# Patient Record
Sex: Male | Born: 1995 | Race: Black or African American | Hispanic: No | Marital: Single | Smoking: Never smoker
Health system: Southern US, Community
[De-identification: ages and names within clinical notes are randomized; demographics above are authoritative.]

## PROBLEM LIST (undated history)

## (undated) ENCOUNTER — Ambulatory Visit (HOSPITAL_COMMUNITY): Admission: EM | Disposition: A | Discharge status: Home / Self Care | Payer: BLUE CROSS/BLUE SHIELD

## (undated) ENCOUNTER — Ambulatory Visit (HOSPITAL_COMMUNITY): Admission: EM | Payer: Managed Care, Other (non HMO) | Source: Home / Self Care

## (undated) DIAGNOSIS — J45909 Unspecified asthma, uncomplicated: Secondary | ICD-10-CM

---
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
## 2016-02-18 ENCOUNTER — Emergency Department (INDEPENDENT_AMBULATORY_CARE_PROVIDER_SITE_OTHER): Payer: Medicaid Other

## 2016-02-18 ENCOUNTER — Encounter (HOSPITAL_COMMUNITY): Payer: Self-pay | Admitting: Emergency Medicine

## 2016-02-18 ENCOUNTER — Emergency Department (INDEPENDENT_AMBULATORY_CARE_PROVIDER_SITE_OTHER)
Admission: EM | Admit: 2016-02-18 | Discharge: 2016-02-18 | Disposition: A | Discharge status: Home / Self Care | Payer: Medicaid Other | Source: Home / Self Care | Attending: Family Medicine | Admitting: Family Medicine

## 2016-02-18 DIAGNOSIS — M2141 Flat foot [pes planus] (acquired), right foot: Secondary | ICD-10-CM

## 2016-02-18 DIAGNOSIS — M2142 Flat foot [pes planus] (acquired), left foot: Secondary | ICD-10-CM | POA: Diagnosis not present

## 2016-02-18 DIAGNOSIS — M79671 Pain in right foot: Secondary | ICD-10-CM

## 2016-02-18 NOTE — ED Notes (Signed)
[facility] with a complaint of a left foot injury and swelling secondary to a soccer game yesterday.

## 2016-02-18 NOTE — ED Provider Notes (Signed)
CSN: [id number]     Arrival date & time 02/18/16  1346 History   First MD Initiated Contact with Patient 02/18/16 1526     Chief Complaint  Patient presents with  . Foot Injury   (Consider location/radiation/quality/duration/timing/severity/associated sxs/prior Treatment) HPI [place] Swie[place] is a right-handed 20 year old male who presents with bilateral foot pain, worse on right, and left wrist pain. Around a year ago he was seen at Urgent Care, diagnosed with pes planus, and instructed to get arch supports, which he did not do. His foot pain is intermittent this past year. Yesterday while playing club soccer, his right foot began to hurt. He played through the pain and afterwards felt he could not bear weight and has been limping. He has only tried ice. The foot is swollen and sometimes feels weak. He has pain with weightbearing and when dangling his foot. He denies numbness/tingling, ecchymosis, or using assistive devices to walk.  His wrist was hit a few times during the soccer game and he also fell on it. He experiences 5/10 pain at his ulnar styloid. He has tried nothing for his pain. He denies numbness/tingling, weak hand grip, and ecchymosis. Two years ago he sprained the wrist and wore a brace for a month.  He had a sports physical last year and [facility]  Past Medical History  Diagnosis Date  . Asthma    History reviewed. No pertinent past surgical history. Family History  Problem Relation Age of Onset  . Diabetes Neg Hx   . Cancer Neg Hx   . Heart failure Neg Hx   . Hyperlipidemia Neg Hx    Social History  Substance Use Topics  . Smoking status: Never Smoker   . Smokeless tobacco: None  . Alcohol Use: No    Review of Systems See HPI; otherwise negative. Allergies  Review of patient's allergies indicates no known allergies.  Home Medications   Prior to Admission medications   Medication Sig Start Date End Date Taking?  Authorizing Provider  albuterol (PROVENTIL HFA;VENTOLIN HFA) 108 (90 Base) MCG/ACT inhaler Inhale into the lungs every 6 (six) hours as needed for wheezing or shortness of breath.   Yes Historical Provider, MD  fluticasone (CUTIVATE) 0.05 % cream Apply topically 2 (two) times daily. 09/23/15   Linna HoffJames D Kindl, MD  naproxen (NAPROSYN) 500 MG tablet Take 1 tablet (500 mg total) by mouth 2 (two) times daily. 05/15/15   Rodolph BongEvan S Corey, MD   Meds Ordered and Administered this Visit  Medications - No data to display  BP 127/71 mmHg  Pulse 57  Temp(Src) 97.7 F (36.5 C) (Oral)  Resp 14  SpO2 97% No data found.   Physical Exam General: Well-appearing, thin, pleasant African American male CV: RRR, no murmurs/rubs/gallops Lungs: CTAB, no wheezes/rales/rhonchi Abd: BS present, soft, NTND Upper extremities: No tenderness to palpation over left ulnar styloid nor throughout elbow/forearm/hands; elbows and wrists exhibit full ROM and 5/5 strength bilaterally; mild pain with left wrist pronation and flexion; no snuffbox tenderness; neurovascularly intact bilaterally Lower extremities: Knees, ankles exhibit full ROM and 5/5 strength bilaterally; neurovascularly intact bilaterally; right foot swollen; no calcaneal/Achilles tenderness; pain with horizontal and vertical compression at plantar surface; ples planus in both feet  ED Course  Procedures (including critical care time)  Labs Review Labs Reviewed - No data to display  Imaging Review Dg Foot Complete Right  02/18/2016  CLINICAL DATA:  Injured foot playing soccer yesterday. EXAM: RIGHT  FOOT COMPLETE - 3+ VIEW COMPARISON:  None. FINDINGS: The joint spaces are maintained.  No acute fracture is identified. IMPRESSION: No acute bony findings. Electronically Signed   By: Rudie Meyer M.D.   On: 02/18/2016 16:38     Visual Acuity Review  Right Eye Distance:   Left Eye Distance:   Bilateral Distance:    Right Eye Near:   Left Eye Near:     Bilateral Near:       Review of XR with patient and his mother.  No acute injury noted.   MDM   1. Foot pain, right   2. Pes planus of both feet    Xrays demonstrated no stress fractures. We will refer patient to podiatry for care of his pes planus. Instruct to use RICE treatment, Activity as tolerated I believe he has a simple left wrist sprain which can be treated with RICE and Ibuprofen OTC.    Tharon Aquas, PA 02/18/16 1659

## 2016-02-18 NOTE — Discharge Instructions (Signed)
Flat Feet Having flat feet is a common condition. One foot or both might be affected. People of any age can have flat feet. In fact, everyone is born with them. But most of the time, the foot gradually develops an arch. That is the curve on the bottom of the foot that creates a gap between the foot and the ground. An arch usually develops in childhood. Sometimes, though, an arch never develops and the foot stays flat on the bottom. Other times, an arch develops but later collapses (caves in). That is what gives the condition its nickname, "fallen arches." The medical term for flat feet is pes planus. Some people have flat feet their whole life and have no problems. For others, the condition causes pain and needs to be corrected.  CAUSES   A problem with the foot's soft tissue; tendons and ligaments could be loose.  This can cause what is called flexible flat feet. That means the shape of the foot changes with pressure. When standing on the toes, a curved arch can be seen. When standing on the ground, the foot is flat.  Wear and tear. Sometimes arches simply flatten over time.  Damage to the posterior tibial tendon. This is the tendon that goes from the inside of the ankle to the bones in the middle of the foot. It is the main support for the arch. If the tendon is injured, stretched or torn, the arch might flatten.  Tarsal coalition. With this condition, two or more bones in the foot are joined together (fused ) during development in the womb. This limits movement and can lead to a flat foot. SYMPTOMS   The foot is even with the ground from toe to heel. Your caregiver will look closely at the inside of the foot while you are standing.  Pain along the bottom of the foot. Some people describe the pain as tightness.  Swelling on the inside of the foot or ankle.  Changes in the way you walk (gait).  The feet lean inward, starting at the ankle (pronation). DIAGNOSIS  To decide if a child or  adult has flat feet, a healthcare provider will probably:  Do a physical examination. This might include having the person stand on his or her toes and then stand normally. The caregiver will also hold the foot and put pressure on the foot in different directions.  Check the person's shoes. The pattern of wear on the soles can offer clues.  Order images (pictures) of the foot. They can help identify the cause of any pain. They also will show injuries to bones or tendons that could be causing the condition. The images can come from:  X-rays.  Computed tomography (CT) scan. This combines X-ray and a computer.  Magnetic resonance imaging (MRI). This uses magnets, radio waves and a computer to take a picture of the foot. It is the best technique to evaluate tendons, ligaments and muscles. TREATMENT   Flexible flat feet usually are painless. Most of the time, gait is not affected. Most children grow out of the condition. Often no treatment is needed. If there is pain, treatment options include:  Orthotics. These are inserts that go in the shoes. They add support and shape to the feet. An orthotic is custom-made from a mold of the foot.  Shoes. Not all shoes are the same. People with flat feet need arch support. However, too much can be painful. It is important to find shoes that offer the right amount   of support. Athletes, especially runners, may need to try shoes made just for people with flatter feet.  Medication. For pain, only take over-the-counter medicine for pain, discomfort, as directed by your caregiver.  Rest. If the feet start to hurt, cut back on the exercise which increases the pain. Use common sense.  For damage to the posterior tibial tendon, options include:  Orthotics. Also adding a wedge on the inside edge may help. This can relieve pressure on the tendon.  Ankle brace, boot or cast. These supports can ease the load on the tendon while it heals.  Surgery. If the tendon is  torn, it might need to be repaired.  For tarsal coalition, similar options apply:  Pain medication.  Orthotics.  A cast and crutches. This keeps weight off the foot.  Physical therapy.  Surgery to remove the bone bridge joining the two bones together. PROGNOSIS  In most people, flat feet do not cause pain or problems. People can go about their normal activities. However, if flat feet are painful, they can and should be treated. Treatment usually relieves the pain. HOME CARE INSTRUCTIONS   Take any medications prescribed by the healthcare provider. Follow the directions carefully.  Wear, or make sure a child wears, orthotics or special shoes if this was suggested. Be sure to ask how often and for how long they should be worn.  Do any exercises or therapy treatments that were suggested.  Take notes on when the pain occurs. This will help healthcare providers decide how to treat the condition.  If surgery is needed, be sure to find out if there is anything that should or should not be done before the operation. SEEK MEDICAL CARE IF:   Pain worsens in the foot or lower leg.  Pain disappears after treatment, but then returns.  Walking or simple exercise becomes difficult or causes foot pain.  Orthotics or special shoes are uncomfortable or painful.   This information is not intended to replace advice given to you by your health care provider. Make sure you discuss any questions you have with your health care provider.   Document Released: 09/29/2009 Document Revised: 02/24/2012 Document Reviewed: 05/31/2015 Elsevier Interactive Patient Education 2016 [employer]  

## 2016-08-15 ENCOUNTER — Ambulatory Visit (HOSPITAL_COMMUNITY)
Admission: EM | Admit: 2016-08-15 | Discharge: 2016-08-15 | Discharge status: Absent without leave | Payer: Medicaid Other

## 2016-10-15 ENCOUNTER — Encounter (HOSPITAL_COMMUNITY): Payer: Self-pay | Admitting: *Deleted

## 2016-10-15 ENCOUNTER — Ambulatory Visit (HOSPITAL_COMMUNITY)
Admission: EM | Admit: 2016-10-15 | Discharge: 2016-10-15 | Disposition: A | Discharge status: Home / Self Care | Payer: Medicaid Other | Attending: Family Medicine | Admitting: Family Medicine

## 2016-10-15 ENCOUNTER — Ambulatory Visit (INDEPENDENT_AMBULATORY_CARE_PROVIDER_SITE_OTHER): Payer: Medicaid Other

## 2016-10-15 DIAGNOSIS — S63601A Unspecified sprain of right thumb, initial encounter: Secondary | ICD-10-CM | POA: Diagnosis not present

## 2016-10-15 NOTE — ED Triage Notes (Signed)
Pt  Was  Playing  Soccer  And  r  Thumb  Was  Injured  By  [employer]  Ball  The  Thumb  Was  Bent  Back  rom  Is  Present  But   Cannot  Lift  certain objects

## 2016-10-15 NOTE — ED Provider Notes (Addendum)
[facility]    CSN: [id number] Arrival date & time: 10/15/16  1743     History   Chief Complaint Chief Complaint  Patient presents with  . Hand Injury    HPI Austin Brock is a 20 y.o. male.   The history is provided by the patient.  Hand Injury  Location:  Finger Finger location:  R thumb Injury: yes   Time since incident:  5 days Mechanism of injury: fall   Mechanism of injury comment:  Fell playing soccer when struck by ball and hyperext.   Past Medical History:  Diagnosis Date  . Asthma     There are no active problems to display for this patient.   History reviewed. No pertinent surgical history.     Home Medications    Prior to Admission medications   Medication Sig Start Date End Date Taking? Authorizing Provider  albuterol (PROVENTIL HFA;VENTOLIN HFA) 108 (90 Base) MCG/ACT inhaler Inhale into the lungs every 6 (six) hours as needed for wheezing or shortness of breath.    Historical Provider, MD  fluticasone (CUTIVATE) 0.05 % cream Apply topically 2 (two) times daily. 09/23/15   Linna HoffJames D Madelyn Tlatelpa, MD  naproxen (NAPROSYN) 500 MG tablet Take 1 tablet (500 mg total) by mouth 2 (two) times daily. 05/15/15   Rodolph BongEvan S Corey, MD    Family History Family History  Problem Relation Age of Onset  . Diabetes Neg Hx   . Cancer Neg Hx   . Heart failure Neg Hx   . Hyperlipidemia Neg Hx     Social History Social History  Substance Use Topics  . Smoking status: Never Smoker  . Smokeless tobacco: Not on file  . Alcohol use No     Allergies   Review of patient's allergies indicates no known allergies.   Review of Systems Review of Systems  Constitutional: Negative.   Musculoskeletal: Positive for joint swelling.  Skin: Negative.   All other systems reviewed and are negative.    Physical Exam Triage Vital Signs ED Triage Vitals  Enc Vitals Group     BP 10/15/16 1753 118/72     Pulse Rate 10/15/16 1753 78     Resp 10/15/16 1753 18   Temp 10/15/16 1753 98.6 F (37 C)     Temp src --      SpO2 10/15/16 1753 100 %     Weight --      Height --      Head Circumference --      Peak Flow --      Pain Score 10/15/16 1752 5     Pain Loc --      Pain Edu? --      Excl. in GC? --    No data found.   Updated Vital Signs BP 118/72 (BP Location: Right Arm)   Pulse 78   Temp 98.6 F (37 C)   Resp 18   SpO2 100%   Visual Acuity Right Eye Distance:   Left Eye Distance:   Bilateral Distance:    Right Eye Near:   Left Eye Near:    Bilateral Near:     Physical Exam  Constitutional: He is oriented to person, place, and time. He appears well-developed and well-nourished. No distress.  Musculoskeletal: He exhibits tenderness. He exhibits no deformity.       Right hand: He exhibits tenderness.       Hands: Neurological: He is alert and oriented to person, place, and time.  Skin: Skin is warm.  Nursing note and vitals reviewed.    UC Treatments / Results  Labs (all labs ordered are listed, but only abnormal results are displayed) Labs Reviewed - No data to display  EKG  EKG Interpretation None       Radiology No results found. X-rays reviewed and report per radiologist.  Procedures Procedures (including critical care time)  Medications Ordered in UC Medications - No data to display   Initial Impression / Assessment and Plan / UC Course  I have reviewed the triage vital signs and the nursing notes.  Pertinent labs & imaging results that were available during my care of the patient were reviewed by me and considered in my medical decision making (see chart for details).  Clinical Course      Final Clinical Impressions(s) / UC Diagnoses   Final diagnoses:  Sprain of right thumb, unspecified site of finger, initial encounter    New Prescriptions Discharge Medication List as of 10/15/2016  6:46 PM       Linna HoffJames D Azalyn Sliwa, MD 10/18/16 2039    Linna HoffJames D Nomi Rudnicki, MD 10/18/16 2040

## 2018-03-29 ENCOUNTER — Emergency Department (HOSPITAL_COMMUNITY)
Admission: EM | Admit: 2018-03-29 | Discharge: 2018-03-29 | Discharge status: Against Medical Advice | Payer: BLUE CROSS/BLUE SHIELD

## 2018-03-29 NOTE — ED Notes (Signed)
Patient came to the desk twice asking when he would be seen because other patients were being seen before him..I explained to the patient that the patients that were called before him are a higher aquity than he was. He was offered a hallway bed that he refused to take because he wanted a room for more privacy. He began to get very loud and started recording me with his cell phone because he felt that he was being mistreated. I asked him to put it away because of hospital policy but he kept being loud and continued to try to record me,I then called security to come assist me,

## 2019-11-04 ENCOUNTER — Encounter (HOSPITAL_COMMUNITY): Payer: Self-pay

## 2019-11-04 ENCOUNTER — Ambulatory Visit (HOSPITAL_COMMUNITY)
Admission: EM | Admit: 2019-11-04 | Discharge: 2019-11-04 | Disposition: A | Discharge status: Home / Self Care | Payer: BLUE CROSS/BLUE SHIELD | Attending: Internal Medicine | Admitting: Internal Medicine

## 2019-11-04 ENCOUNTER — Other Ambulatory Visit: Payer: Self-pay

## 2019-11-04 DIAGNOSIS — U071 COVID-19: Secondary | ICD-10-CM

## 2019-11-04 LAB — POC SARS CORONAVIRUS 2 AG: SARS Coronavirus 2 Ag: POSITIVE — AB

## 2019-11-04 LAB — POC SARS CORONAVIRUS 2 AG -  ED: SARS Coronavirus 2 Ag: POSITIVE — AB

## 2019-11-04 NOTE — ED Triage Notes (Signed)
Pt presents to UC w/ c/o sore throat, runny nose, headache since yesterday.

## 2019-11-04 NOTE — Discharge Instructions (Addendum)
Your rapid Covid test is positive You need to go home and quarantine for 10 days, fever free for 3 days and symptoms improving in order to get back to work. If your symptoms worsen to include worsening fever, shortness of breath you need to go to the hospital.

## 2019-11-05 NOTE — ED Provider Notes (Signed)
[facility]    CSN: [id number] Arrival date & time: 11/04/19  1452      History   Chief Complaint Chief Complaint  Patient presents with  . Sore Throat    HPI [place] is a 23 y.o. male.   Pt is a 23 year old male that presents today with sore throat, runny nose, headache. This has been constant since yesterday. He has not taken anything for the symptoms. No known sick contacts. Low grade fever. No chest pain, SOB.   ROS per HPI      Past Medical History:  Diagnosis Date  . Asthma     There are no active problems to display for this patient.   History reviewed. No pertinent surgical history.     Home Medications    Prior to Admission medications   Medication Sig Start Date End Date Taking? Authorizing Provider  albuterol (PROVENTIL HFA;VENTOLIN HFA) 108 (90 Base) MCG/ACT inhaler Inhale into the lungs every 6 (six) hours as needed for wheezing or shortness of breath.    [provider]  fluticasone (CUTIVATE) 0.05 % cream Apply topically 2 (two) times daily. 09/23/15   Linna Hoff, MD  naproxen (NAPROSYN) 500 MG tablet Take 1 tablet (500 mg total) by mouth 2 (two) times daily. 05/15/15   Rodolph Bong, MD    Family History Family History  Problem Relation Age of Onset  . Healthy Mother   . Healthy Father   . Diabetes Neg Hx   . Cancer Neg Hx   . Heart failure Neg Hx   . Hyperlipidemia Neg Hx     Social History Social History   Tobacco Use  . Smoking status: Never Smoker  . Smokeless tobacco: Never Used  Substance Use Topics  . Alcohol use: No  . Drug use: No     Allergies   Patient has no known allergies.   Review of Systems Review of Systems   Physical Exam Triage Vital Signs ED Triage Vitals  Enc Vitals Group     BP 11/04/19 1529 135/83     Pulse Rate 11/04/19 1529 80     Resp 11/04/19 1529 16     Temp 11/04/19 1529 99.4 F (37.4 C)     Temp Source 11/04/19 1529 Oral     SpO2 11/04/19 1529 100 %     Weight --      Height --      Head Circumference --      Peak Flow --      Pain Score 11/04/19 1531 7     Pain Loc --      Pain Edu? --      Excl. in GC? --    No data found.  Updated Vital Signs BP 135/83 (BP Location: Left Arm)   Pulse 80   Temp 99.4 F (37.4 C) (Oral)   Resp 16   SpO2 100%   Visual Acuity Right Eye Distance:   Left Eye Distance:   Bilateral Distance:    Right Eye Near:   Left Eye Near:    Bilateral Near:     Physical Exam Vitals signs and nursing note reviewed.  Constitutional:      General: He is not in acute distress.    Appearance: Normal appearance. He is not ill-appearing, toxic-appearing or diaphoretic.  HENT:     Head: Normocephalic and atraumatic.     Nose: Nose normal.  Eyes:     Conjunctiva/sclera: Conjunctivae normal.  Neck:     Musculoskeletal: Normal range of motion.  Pulmonary:     Effort: Pulmonary effort is normal.  Musculoskeletal: Normal range of motion.  Skin:    General: Skin is warm and dry.  Neurological:     Mental Status: He is alert.  Psychiatric:        Mood and Affect: Mood normal.      UC Treatments / Results  Labs (all labs ordered are listed, but only abnormal results are displayed) Labs Reviewed  POC SARS CORONAVIRUS 2 AG -  ED - Abnormal; Notable for the following components:      Result Value   SARS Coronavirus 2 Ag POSITIVE (*)    All other components within normal limits  POC SARS CORONAVIRUS 2 AG - Abnormal; Notable for the following components:   SARS Coronavirus 2 Ag POSITIVE (*)    All other components within normal limits    EKG   Radiology No results found.  Procedures Procedures (including critical care time)  Medications Ordered in UC Medications - No data to display  Initial Impression / Assessment and Plan / UC Course  I have reviewed the triage vital signs and the nursing notes.  Pertinent labs & imaging results that were available during my care of the patient were  reviewed by me and considered in my medical decision making (see chart for details).     COVID-19-rapid Covid test positive.  We will have him go home and quarantine for 10 days. OTC meds as needed for symptoms.  Return precautions given Final Clinical Impressions(s) / UC Diagnoses   Final diagnoses:  [id number]     Discharge Instructions     Your rapid Covid test is positive You need to go home and quarantine for 10 days, fever free for 3 days and symptoms improving in order to get back to work. If your symptoms worsen to include worsening fever, shortness of breath you need to go to the hospital.    ED Prescriptions    None     PDMP not reviewed this encounter.   Orvan July, NP 11/05/19 [phone]

## 2019-11-09 ENCOUNTER — Ambulatory Visit (INDEPENDENT_AMBULATORY_CARE_PROVIDER_SITE_OTHER)
Admission: RE | Admit: 2019-11-09 | Discharge: 2019-11-09 | Disposition: A | Discharge status: Home / Self Care | Payer: BLUE CROSS/BLUE SHIELD | Source: Ambulatory Visit

## 2019-11-09 DIAGNOSIS — U071 COVID-19: Secondary | ICD-10-CM

## 2019-11-09 MED ORDER — CETIRIZINE-PSEUDOEPHEDRINE ER 5-120 MG PO TB12: 1.0000 | ORAL_TABLET | Freq: Every day | ORAL | 0 refills | Status: DC

## 2019-11-09 MED ORDER — FLUTICASONE PROPIONATE 50 MCG/ACT NA SUSP: 2.0000 | Freq: Every day | NASAL | 0 refills | Status: DC

## 2019-11-09 MED ORDER — ACETAMINOPHEN 500 MG PO CHEW: 500.0000 mg | CHEWABLE_TABLET | Freq: Four times a day (QID) | ORAL | 0 refills | Status: DC | PRN

## 2019-11-09 MED ORDER — BENZONATATE 100 MG PO CAPS: 100.0000 mg | ORAL_CAPSULE | Freq: Three times a day (TID) | ORAL | 0 refills | Status: DC

## 2019-11-09 NOTE — Discharge Instructions (Signed)
COVID test positive.    In the meantime: You should remain isolated in your home for 10 days from symptom onset AND greater than 72 hours after symptoms resolution (absence of fever without the use of fever-reducing medication and improvement in respiratory symptoms), whichever is longer Get plenty of rest and push fluids Tessalon Perles prescribed for cough Zyrtec-D prescribed for nasal congestion, runny nose, and/or sore throat Flonase prescribed for nasal congestion and runny nose Use medications daily for symptom relief Use OTC medications like ibuprofen or tylenol as needed fever or pain Tylenol also sent into pharmacy Call or go to the ED if you have any new or worsening symptoms such as fever, worsening cough, shortness of breath, chest tightness, chest pain, turning blue, changes in mental status, etc..Marland Kitchen

## 2019-11-09 NOTE — ED Provider Notes (Signed)
[place] Virtual Visit via Video Note:  Austin Brock  initiated request for Telemedicine visit with [facility] team. I connected with [facility]  on 11/09/2019 at 2:09 PM  for a synchronized telemedicine visit using a video enabled HIPPA compliant telemedicine application. I verified that I am speaking with [facility]  using two identifiers. [place], PA-C  was physically located in a [place] [facility] site and [facility] was located at a different location.   The limitations of evaluation and management by telemedicine as well as the availability of in-person appointments were discussed. Patient was informed that he  may incur a bill ( including co-pay) for this virtual visit encounter. Austin Brock  expressed understanding and gave verbal consent to proceed with virtual visit.   [id number] 11/09/19 Arrival Time: 2993  CC: COVID symptoms; + covid test  SUBJECTIVE: History from: patient.  Austin Brock is a 23 y.o. male hx significant for asthma, who presents with headache, runny nose, sneezing, cough with dark yellow sputum, sore throat, and fatigue that began on 11/03/2019.  Tested positive for COVID on 11/04/2019.  Has tried OTC ibuprofen twice daily without relief.  Symptoms are made worse with activity.  Denies previous symptoms in the past.   Denies fever, chills, rhinorrhea, SOB, wheezing, chest pain, nausea, vomiting, changes in bowel or bladder habits.    ROS: As per HPI.  All other pertinent ROS negative.     Past Medical History:  Diagnosis Date  . Asthma    History reviewed. No pertinent surgical history. No Known Allergies No current facility-administered medications on file prior to encounter.    Current Outpatient Medications on File Prior to Encounter  Medication Sig Dispense Refill  . albuterol (PROVENTIL HFA;VENTOLIN HFA) 108 (90 Base) MCG/ACT inhaler Inhale into the lungs every 6 (six) hours as needed for  wheezing or shortness of breath.    . fluticasone (CUTIVATE) 0.05 % cream Apply topically 2 (two) times daily. 30 g 0  . naproxen (NAPROSYN) 500 MG tablet Take 1 tablet (500 mg total) by mouth 2 (two) times daily. 30 tablet 0    OBJECTIVE:   There were no vitals filed for this visit.  General appearance: alert; no distress Eyes: EOMI grossly; wears corrective lenses HENT: normocephalic; atraumatic Neck: supple with FROM Lungs: normal respiratory effort; speaking in full sentences without difficulty Extremities: moves extremities without difficulty Skin: No obvious rashes Neurologic: No facial asymmetries Psychological: alert and cooperative; normal mood and affect  ASSESSMENT & PLAN:  1. COVID-19 virus infection     Meds ordered this encounter  Medications  . acetaminophen (TYLENOL) 500 MG chewable tablet    Sig: Chew 1 tablet (500 mg total) by mouth every 6 (six) hours as needed for pain or fever.    Dispense:  30 tablet    Refill:  0    Order Specific Question:   Supervising Provider    Answer:   Raylene Everts [[id number]]  . cetirizine-pseudoephedrine (ZYRTEC-D) 5-120 MG tablet    Sig: Take 1 tablet by mouth daily.    Dispense:  30 tablet    Refill:  0    Order Specific Question:   Supervising Provider    Answer:   Raylene Everts [[id number]]  . fluticasone (FLONASE) 50 MCG/ACT nasal spray    Sig: Place 2 sprays into both nostrils daily.    Dispense:  16 g    Refill:  0    Order Specific Question:  Supervising Provider    Answer:   Eustace Moore [[id number]]  . benzonatate (TESSALON) 100 MG capsule    Sig: Take 1 capsule (100 mg total) by mouth every 8 (eight) hours.    Dispense:  21 capsule    Refill:  0    Order Specific Question:   Supervising Provider    Answer:   Eustace Moore [[id number]]    COVID test positive.    In the meantime: You should remain isolated in your home for 10 days from symptom onset AND greater than 72 hours after symptoms  resolution (absence of fever without the use of fever-reducing medication and improvement in respiratory symptoms), whichever is longer Get plenty of rest and push fluids Tessalon Perles prescribed for cough Zyrtec-D prescribed for nasal congestion, runny nose, and/or sore throat Flonase prescribed for nasal congestion and runny nose Use medications daily for symptom relief Use OTC medications like ibuprofen or tylenol as needed fever or pain Tylenol also sent into pharmacy Call or go to the ED if you have any new or worsening symptoms such as fever, worsening cough, shortness of breath, chest tightness, chest pain, turning blue, changes in mental status, etc...  I discussed the assessment and treatment plan with the patient. The patient was provided an opportunity to ask questions and all were answered. The patient agreed with the plan and demonstrated an understanding of the instructions.   The patient was advised to call back or seek an in-person evaluation if the symptoms worsen or if the condition fails to improve as anticipated.  I provided 10 minutes of non-face-to-face time during this encounter.  [place], PA-C  11/09/2019 2:09 PM          Rennis Harding, PA-C 11/09/19 1409

## 2019-11-10 ENCOUNTER — Telehealth: Payer: BLUE CROSS/BLUE SHIELD

## 2019-11-10 ENCOUNTER — Inpatient Hospital Stay
Admission: RE | Admit: 2019-11-10 | Discharge: 2019-11-10 | Disposition: A | Discharge status: Home / Self Care | Payer: BLUE CROSS/BLUE SHIELD | Source: Ambulatory Visit

## 2019-11-12 ENCOUNTER — Telehealth: Payer: BLUE CROSS/BLUE SHIELD

## 2019-11-15 ENCOUNTER — Telehealth (HOSPITAL_COMMUNITY): Payer: Self-pay | Admitting: Emergency Medicine

## 2019-11-15 ENCOUNTER — Ambulatory Visit (HOSPITAL_COMMUNITY)
Admission: EM | Admit: 2019-11-15 | Discharge: 2019-11-15 | Disposition: A | Discharge status: Home / Self Care | Payer: BLUE CROSS/BLUE SHIELD

## 2019-11-15 ENCOUNTER — Telehealth: Payer: BLUE CROSS/BLUE SHIELD

## 2019-11-15 ENCOUNTER — Other Ambulatory Visit: Payer: Self-pay

## 2019-11-15 NOTE — ED Triage Notes (Signed)
Pt mistakenly thought he needed to be retested. Pt was positive for covid on 11/19. Has met his quarantine requirements. No symptoms, no fever. Per Dr. Meda Coffee, okay to write note saying he can return to work.

## 2019-11-15 NOTE — Telephone Encounter (Signed)
Spoke to pt over the phone regarding his medications. Answered all of his questions.

## 2020-06-17 ENCOUNTER — Ambulatory Visit (HOSPITAL_COMMUNITY)
Admission: EM | Admit: 2020-06-17 | Discharge: 2020-06-17 | Disposition: A | Discharge status: Home / Self Care | Payer: BLUE CROSS/BLUE SHIELD

## 2020-12-09 ENCOUNTER — Other Ambulatory Visit: Payer: Self-pay

## 2020-12-09 ENCOUNTER — Emergency Department (HOSPITAL_COMMUNITY): Payer: Medicaid Other

## 2020-12-09 ENCOUNTER — Emergency Department (HOSPITAL_COMMUNITY)
Admission: EM | Admit: 2020-12-09 | Discharge: 2020-12-09 | Disposition: A | Discharge status: Home / Self Care | Payer: Medicaid Other | Attending: Emergency Medicine | Admitting: Emergency Medicine

## 2020-12-09 ENCOUNTER — Encounter (HOSPITAL_COMMUNITY): Payer: Self-pay

## 2020-12-09 DIAGNOSIS — Z7952 Long term (current) use of systemic steroids: Secondary | ICD-10-CM | POA: Insufficient documentation

## 2020-12-09 DIAGNOSIS — E876 Hypokalemia: Secondary | ICD-10-CM | POA: Insufficient documentation

## 2020-12-09 DIAGNOSIS — F12920 Cannabis use, unspecified with intoxication, uncomplicated: Secondary | ICD-10-CM

## 2020-12-09 DIAGNOSIS — R0602 Shortness of breath: Secondary | ICD-10-CM | POA: Insufficient documentation

## 2020-12-09 DIAGNOSIS — R45851 Suicidal ideations: Secondary | ICD-10-CM | POA: Insufficient documentation

## 2020-12-09 DIAGNOSIS — F12929 Cannabis use, unspecified with intoxication, unspecified: Secondary | ICD-10-CM | POA: Insufficient documentation

## 2020-12-09 DIAGNOSIS — J45909 Unspecified asthma, uncomplicated: Secondary | ICD-10-CM | POA: Insufficient documentation

## 2020-12-09 LAB — COMPREHENSIVE METABOLIC PANEL
ALT: 16 U/L (ref 0–44)
AST: 32 U/L (ref 15–41)
Albumin: 4.1 g/dL (ref 3.5–5.0)
Alkaline Phosphatase: 93 U/L (ref 38–126)
Anion gap: 12 (ref 5–15)
BUN: 8 mg/dL (ref 6–20)
CO2: 23 mmol/L (ref 22–32)
Calcium: 8.8 mg/dL — ABNORMAL LOW (ref 8.9–10.3)
Chloride: 104 mmol/L (ref 98–111)
Creatinine, Ser: 1.17 mg/dL (ref 0.61–1.24)
GFR, Estimated: >60 mL/min (ref >60)
Glucose, Bld: 127 mg/dL — ABNORMAL HIGH (ref 70–99)
Potassium: 2.5 mmol/L — CL (ref 3.5–5.1)
Sodium: 139 mmol/L (ref 135–145)
Total Bilirubin: 1.8 mg/dL — ABNORMAL HIGH (ref 0.3–1.2)
Total Protein: 7.1 g/dL (ref 6.5–8.1)

## 2020-12-09 LAB — CBC
HCT: 42.5 % (ref 39.0–52.0)
Hemoglobin: 14.9 g/dL (ref 13.0–17.0)
MCH: 30.3 pg (ref 26.0–34.0)
MCHC: 35.1 g/dL (ref 30.0–36.0)
MCV: 86.4 fL (ref 80.0–100.0)
Platelets: 169 10*3/uL (ref 150–400)
RBC: 4.92 MIL/uL (ref 4.22–5.81)
RDW: 11.1 % — ABNORMAL LOW (ref 11.5–15.5)
WBC: 6.7 10*3/uL (ref 4.0–10.5)
nRBC: 0 % (ref 0.0–0.2)

## 2020-12-09 LAB — RAPID URINE DRUG SCREEN, HOSP PERFORMED
Amphetamines: NOT DETECTED
Barbiturates: NOT DETECTED
Benzodiazepines: NOT DETECTED
Cocaine: NOT DETECTED
Opiates: NOT DETECTED
Tetrahydrocannabinol: POSITIVE — AB

## 2020-12-09 LAB — ETHANOL: Alcohol, Ethyl (B): <10 mg/dL (ref <10)

## 2020-12-09 LAB — SALICYLATE LEVEL: Salicylate Lvl: <7 mg/dL — ABNORMAL LOW (ref 7.0–30.0)

## 2020-12-09 LAB — MAGNESIUM: Magnesium: 1.7 mg/dL (ref 1.7–2.4)

## 2020-12-09 LAB — ACETAMINOPHEN LEVEL: Acetaminophen (Tylenol), Serum: <10 ug/mL — ABNORMAL LOW (ref 10–30)

## 2020-12-09 MED ORDER — MAGNESIUM SULFATE 2 GM/50ML IV SOLN
2.0000 g | Freq: Once | INTRAVENOUS | Status: AC
  Administered 2020-12-09: 02:00:00 2 g via INTRAVENOUS
  Filled 2020-12-09: qty 50

## 2020-12-09 MED ORDER — POTASSIUM CHLORIDE CRYS ER 20 MEQ PO TBCR
60.0000 meq | EXTENDED_RELEASE_TABLET | Freq: Once | ORAL | Status: AC
  Administered 2020-12-09: 02:00:00 60 meq via ORAL
  Filled 2020-12-09: qty 3

## 2020-12-09 MED ORDER — SODIUM CHLORIDE 0.9 % IV BOLUS
1000.0000 mL | Freq: Once | INTRAVENOUS | Status: AC
  Administered 2020-12-09: 12:00:00 1000 mL via INTRAVENOUS

## 2020-12-09 MED ORDER — POTASSIUM CHLORIDE 10 MEQ/100ML IV SOLN
10.0000 meq | INTRAVENOUS | Status: AC
  Administered 2020-12-09 (×2): 10 meq via INTRAVENOUS
  Filled 2020-12-09 (×3): qty 100

## 2020-12-09 MED ORDER — LORAZEPAM 2 MG/ML IJ SOLN
1.0000 mg | Freq: Once | INTRAMUSCULAR | Status: AC
  Administered 2020-12-09: 02:00:00 1 mg via INTRAVENOUS
  Filled 2020-12-09: qty 1

## 2020-12-09 MED ORDER — POTASSIUM CHLORIDE CRYS ER 20 MEQ PO TBCR: 20.0000 meq | EXTENDED_RELEASE_TABLET | Freq: Two times a day (BID) | ORAL | 0 refills | Status: DC

## 2020-12-09 MED ORDER — SODIUM CHLORIDE 0.9 % IV BOLUS
1000.0000 mL | Freq: Once | INTRAVENOUS | Status: AC
  Administered 2020-12-09: 02:00:00 1000 mL via INTRAVENOUS

## 2020-12-09 NOTE — ED Notes (Signed)
Tele psych machine to bedside  

## 2020-12-09 NOTE — ED Notes (Addendum)
Upon ambulation of self in exam room, pt reported feeling dizzy and "still a little weird." Slightly stumbled while walking around bed. Vital signs remained stable. Pt was instructed to lay back down in bed after feeling dizzy.   Per PO fluid challenge, pt drank 1 cup of water and has continued to deny feelings of nausea.

## 2020-12-09 NOTE — ED Notes (Signed)
This RN wasted 0.5 ml of ativan with Biochemist, clinical and place waste in sink witness by [employer]

## 2020-12-09 NOTE — ED Notes (Signed)
Pt d/c home per MD order. Discharge summary reviewed with pt, pt verbalizes understanding. No s/s of acute distress noted. Ambulatory off unit. Father at bedside.  Discharged home with father,.

## 2020-12-09 NOTE — BH Assessment (Signed)
TTS attempted to assess patient, but he would not wake up to participate in the assessment.  TTS to attempt to assess patient later this morning.

## 2020-12-09 NOTE — ED Triage Notes (Signed)
Patient coming from home, reports he did something bad tonight", states he smoked some weed tonight, endorses feeling pressured speech, anxiety, depression, reports SI and HI, specific people but will not elaborate, family reports he may have taken something else.

## 2020-12-09 NOTE — ED Provider Notes (Signed)
[facility] EMERGENCY DEPARTMENT Provider Note   CSN: [id number] Arrival date & time: 12/09/20  0025     History Chief Complaint  Patient presents with  . Shortness of Breath  . Suicidal    Rayman Petrosian is a 24 y.o. male.  HPI Patient is a 24 year old male who presents the emergency department due to SI.  Patient states that he was "hanging out with his brothers" earlier tonight and they smoke marijuana.  He has smoked marijuana multiple times in the past.  No alcohol use.  He states that he went to sleep and woke up "in this state".  He states he was feeling chest pain and shortness of breath which has since alleviated.  He feels anxious as well as depressed.  Reports intermittent SI as well as HI.  He states that "sometimes he thinks about it".  No specific plan.  Patient denies any other drug use but has relatives noted to nursing staff that he "may have taken something else".  Patient tremulous on my exam and somewhat tangential.  Patient denies any visual or auditory hallucinations.     Past Medical History:  Diagnosis Date  . Asthma     There are no problems to display for this patient.   History reviewed. No pertinent surgical history.     Family History  Problem Relation Age of Onset  . Healthy Mother   . Healthy Father   . Diabetes Neg Hx   . Cancer Neg Hx   . Heart failure Neg Hx   . Hyperlipidemia Neg Hx     Social History   Tobacco Use  . Smoking status: Never Smoker  . Smokeless tobacco: Never Used  Substance Use Topics  . Alcohol use: No  . Drug use: Yes    Types: Marijuana    Home Medications Prior to Admission medications   Medication Sig Start Date End Date Taking? Authorizing Provider  acetaminophen (TYLENOL) 500 MG chewable tablet Chew 1 tablet (500 mg total) by mouth every 6 (six) hours as needed for pain or fever. 11/09/19   Wurst, [place], PA-C  albuterol (PROVENTIL HFA;VENTOLIN HFA) 108 (90 Base) MCG/ACT inhaler  Inhale into the lungs every 6 (six) hours as needed for wheezing or shortness of breath.    [provider]  benzonatate (TESSALON) 100 MG capsule Take 1 capsule (100 mg total) by mouth every 8 (eight) hours. 11/09/19   Wurst, [place], PA-C  cetirizine-pseudoephedrine (ZYRTEC-D) 5-120 MG tablet Take 1 tablet by mouth daily. 11/09/19   Wurst, [place], PA-C  fluticasone (CUTIVATE) 0.05 % cream Apply topically 2 (two) times daily. 09/23/15   Linna Hoff, MD  fluticasone (FLONASE) 50 MCG/ACT nasal spray Place 2 sprays into both nostrils daily. 11/09/19   Wurst, [place], PA-C  naproxen (NAPROSYN) 500 MG tablet Take 1 tablet (500 mg total) by mouth 2 (two) times daily. 05/15/15   Rodolph Bong, MD    Allergies    Patient has no known allergies.  Review of Systems   Review of Systems  All other systems reviewed and are negative. Ten systems reviewed and are negative for acute change, except as noted in the HPI.    Physical Exam Updated Vital Signs BP (!) 150/84 (BP Location: Left Arm)   Pulse 96   Temp 97.8 F (36.6 C) (Oral)   Resp 18   Ht 5\' 4"  (1.626 m)   Wt 61.2 kg   SpO2 100%   BMI 23.17 kg/m  Physical Exam Vitals and nursing note reviewed.  Constitutional:      General: He is not in acute distress.    Appearance: Normal appearance. He is well-developed and normal weight. He is not ill-appearing, toxic-appearing or diaphoretic.     Comments: Well-developed adult male lying in the semi-Fowlers position.  Mildly tremulous on my exam.  Tangential.  HENT:     Head: Normocephalic and atraumatic.     Right Ear: External ear normal.     Left Ear: External ear normal.     Nose: Nose normal.     Mouth/Throat:     Mouth: Mucous membranes are moist.     Pharynx: Oropharynx is clear. No oropharyngeal exudate or posterior oropharyngeal erythema.  Eyes:     Extraocular Movements: Extraocular movements intact.  Cardiovascular:     Rate and Rhythm: Normal rate and regular  rhythm.     Pulses: Normal pulses.     Heart sounds: Normal heart sounds. No murmur heard. No friction rub. No gallop.   Pulmonary:     Effort: Pulmonary effort is normal. No respiratory distress.     Breath sounds: Normal breath sounds. No stridor. No wheezing, rhonchi or rales.  Abdominal:     General: Abdomen is flat.     Tenderness: There is no abdominal tenderness.  Musculoskeletal:        General: Normal range of motion.     Cervical back: Normal range of motion and neck supple. No tenderness.  Skin:    General: Skin is warm and dry.  Neurological:     General: No focal deficit present.     Mental Status: He is alert and oriented to person, place, and time.  Psychiatric:        Mood and Affect: Mood is anxious.        Speech: Speech is rapid and pressured.        Behavior: Behavior is hyperactive.        Thought Content: Thought content is paranoid.    ED Results / Procedures / Treatments   Labs (all labs ordered are listed, but only abnormal results are displayed) Labs Reviewed  COMPREHENSIVE METABOLIC PANEL - Abnormal; Notable for the following components:      Result Value   Potassium 2.5 (*)    Glucose, Bld 127 (*)    Calcium 8.8 (*)    Total Bilirubin 1.8 (*)    All other components within normal limits  SALICYLATE LEVEL - Abnormal; Notable for the following components:   Salicylate Lvl <7.0 (*)    All other components within normal limits  ACETAMINOPHEN LEVEL - Abnormal; Notable for the following components:   Acetaminophen (Tylenol), Serum <10 (*)    All other components within normal limits  CBC - Abnormal; Notable for the following components:   RDW 11.1 (*)    All other components within normal limits  RAPID URINE DRUG SCREEN, HOSP PERFORMED - Abnormal; Notable for the following components:   Tetrahydrocannabinol POSITIVE (*)    All other components within normal limits  ETHANOL  MAGNESIUM   EKG None  Radiology No results  found.  Procedures Procedures (including critical care time)  Medications Ordered in ED Medications  sodium chloride 0.9 % bolus 1,000 mL (0 mLs Intravenous Stopped 12/09/20 0356)  LORazepam (ATIVAN) injection 1 mg (1 mg Intravenous Given 12/09/20 0154)  potassium chloride SA (KLOR-CON) CR tablet 60 mEq (60 mEq Oral Given 12/09/20 0206)  potassium chloride 10 mEq in 100 mL  IVPB (0 mEq Intravenous Stopped 12/09/20 0514)  magnesium sulfate IVPB 2 g 50 mL (0 g Intravenous Stopped 12/09/20 1093)    ED Course  I have reviewed the triage vital signs and the nursing notes.  Pertinent labs & imaging results that were available during my care of the patient were reviewed by me and considered in my medical decision making (see chart for details).  Clinical Course as of 12/09/20 0622  Sat Dec 09, 2020  0153 Potassium(!!): 2.5 [LJ]  0423 Patient has been sleeping comfortably in bed.  He has been reassessed on multiple occasions.  He has now much more alert and oriented.  He is A&O x4 this time.  Patient states he is feeling much better.  Denies any SI or HI to me.  States he was just feeling unwell earlier in that "he smoked way more weed than normal".  He is eager to go home.  We will have the nursing staff ambulate the patient. [LJ]  [id number] Patient successfully p.o. challenge.  Nursing staff feels that patient was a little uneasy on his feet.  Will continue to monitor and will reassess. [LJ]  0557 Tetrahydrocannabinol(!): POSITIVE [LJ]    Clinical Course User Index [LJ] Placido Sou, PA-C   MDM Rules/Calculators/A&P                          Patient is a 24 year old male who presents the emergency department due to what initially appeared to be SI/HI.  Patient endorsed marijuana use earlier tonight.  Initially patient was tangential and extremely anxious on my exam.  He was given Ativan as well as IV fluids.  Patient has been sleeping comfortably for many hours.  Patient reassessed and  appears to be feeling much better.  He is now A&O x4.  Denies any SI/HI to me.  He states that he smoked marijuana with friends earlier tonight and smoked an abnormally large amount.  Denies any other drug use to me UDS shows THC but otherwise no other findings.  Patient hypokalemic at 2.5.  He was given multiple rounds of IV potassium as well as oral potassium.  Patient also given IV magnesium.  Will discharge on a course of oral potassium.  Recommended following up with his PCP and having his potassium rechecked.  Patient also given referral to [facility] community health and wellness.  Feel that his initial complaints were secondary to his anxiety and inebriated state.  Given his initial complaints of SI/HI, will order TTS consult on patient.   It is the end of my shift and pt care will be transferred to [facility] pending TTS consult and recommendations. If pt cleared by psych and can ambulate in the emergency department, feel that he is safe for discharge home.  Final Clinical Impression(s) / ED Diagnoses Final diagnoses:  Cannabis intoxication without complication (HCC)  Hypokalemia   Rx / DC Orders ED Discharge Orders         Ordered    potassium chloride SA (KLOR-CON) 20 MEQ tablet  2 times daily        12/09/20 0624           Placido Sou, PA-C 12/09/20 0645    Mesner, Barbara Cower, MD 12/09/20 2257

## 2020-12-09 NOTE — Discharge Instructions (Addendum)
Like we discussed, I am prescribing you oral potassium.  Your potassium was quite low today.  You need to take this potassium supplement once per day.  Make sure that you have your potassium rechecked by your regular doctor.  If you do not have a regular doctor, I have given you a referral to [facility] community health and wellness.  This is right down the road from [facility].  Please give them a call and schedule a follow-up appointment.  If you develop any new or worsening symptoms once again, please return to the emergency department for reevaluation.  It was a pleasure to meet you.

## 2020-12-09 NOTE — BH Assessment (Signed)
Comprehensive Clinical Assessment (CCA) Note  12/09/2020 Austin Brock [id number]   Per EDP: Patient is a 24 year old male who presents the emergency department due to SI.  Patient states that he was "hanging out with his brothers" earlier tonight and they smoke marijuana.  He has smoked marijuana multiple times in the past.  No alcohol use.  He states that he went to sleep and woke up "in this state".  He states he was feeling chest pain and shortness of breath which has since alleviated.  He feels anxious as well as depressed.  Reports intermittent SI as well as HI.  He states that "sometimes he thinks about it".  No specific plan.  Patient denies any other drug use but has relatives noted to nursing staff that he "may have taken something else".  Patient tremulous on my exam and somewhat tangential.  Patient denies any visual or auditory hallucinations.  Per TTS Assessment: Patient states that he was using marijuana last night and states that his thoughts became distorted.  He states that he could not distinguish what was real and what was not real.  Patient states that he has smoked marijuana in the past and never had any reaction like this, but states that he knows that it was the marijuana use because he states that everything is fine now.  Patient states that this event has really scared him.  Patient states that he sometimes has fleeting suicidal thoughts, but states that he does not really want to die and he has no plan or intent.  He states that he sometimes feels like he needs a break from the world.  Patient states that he has never tried to hurt himself in the past.  He denies HI.  Patient states that he has never experienced any AVH, but states that he states that he saw the world moving very fact and he felt lik he was stuck and could not move.  Patient states that last night was the sixth time in his life that he used marijuana.  He states that he has experimented with beer on 4 occasions, but  states that he does not use anyhting on a regular basis.  Patient states that he sleeps 7-8 hours per night and states that his appetite has not been real good and he states that he has lost some weight, but he is unsure how much.  Patient denies any history of abuse or slf-mutilation.  Patient is now presenting alert and oriented.  Denies SI/HI Psychosis and attributes presenting problem to his marijuana use.  His judgement, insight and impulse control are mildly impaired.  His thoughts appear to be organized and his memory intact.  Chief Complaint:  Chief Complaint  Patient presents with  . Shortness of Breath  . Suicidal  . 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 Self  Referral name: No data recorded Referral phone number: No data recorded  Whom do you see for routine medical problems? I don't have a doctor  Practice/Facility Name: No data recorded Practice/Facility Phone Number: No data recorded Name of Contact: No data recorded Contact Number: No data recorded Contact Fax Number: No data recorded Prescriber Name: No data recorded Prescriber Address (if known): No data recorded  What Is the Reason for Your Visit/Call Today? Patient states that he experienced  psychosis while using marijuana and could not distinguish what was real and what was not real and it scared him so he came to the hospital  How Long Has This Been Causing You Problems? <Week  What Do You Feel Would Help You the Most Today? Other (Comment) (patient states that he is fine since the drugs have left his system)   Have You Recently Been in Any Inpatient Treatment (Hospital/Detox/Crisis Center/28-Day Program)? No  Name/Location of Program/Hospital:No data recorded How Long Were You There? No data recorded When  Were You Discharged? No data recorded  Have You Ever Received Services From [facility] Before? No  Who Do You See at [facility]? No data recorded  Have You Recently Had Any Thoughts About Hurting Yourself? Yes  Are You Planning to Commit Suicide/Harm Yourself At This time? No   Have you Recently Had Thoughts About Hurting Someone Karolee Ohs? No  Explanation: No data recorded  Have You Used Any Alcohol or Drugs in the Past 24 Hours? Yes  How Long Ago Did You Use Drugs or Alcohol? 0000 (last used last night)  What Did You Use and How Much? patient states that he smoked marijuana last night   Do You Currently Have a Therapist/Psychiatrist? No  Name of Therapist/Psychiatrist: No data recorded  Have You Been Recently Discharged From Any Office Practice or Programs? No  Explanation of Discharge From Practice/Program: No data recorded    CCA Screening Triage Referral Assessment Type of Contact: Tele-Assessment  Is this Initial or Reassessment? Initial Assessment  Date Telepsych consult ordered in [facility]:  12/09/2020  Time Telepsych consult ordered in [facility]:  0640   Patient Reported Information Reviewed? Yes  Patient Left Without Being Seen? No data recorded Reason for Not Completing Assessment: No data recorded  Collateral Involvement: no collateral available   Does Patient Have a Court Appointed Legal Guardian? No data recorded Name and Contact of Legal Guardian: No data recorded If Minor and Not Living with Parent(s), Who has Custody? No data recorded Is CPS involved or ever been involved? Never  Is APS involved or ever been involved? Never   Patient Determined To Be At Risk for Harm To Self or Others Based on Review of Patient Reported Information or Presenting Complaint? No (no plan or intent)  Method: No data recorded Availability of Means: No data recorded Intent: No data recorded Notification Required: No data recorded Additional Information for Danger to Others  Potential: No data recorded Additional Comments for Danger to Others Potential: No data recorded Are There Guns or Other Weapons in Your Home? No data recorded Types of Guns/Weapons: No data recorded Are These Weapons Safely Secured?                            No data recorded Who Could Verify You Are Able To Have These Secured: No data recorded Do You Have any Outstanding Charges, Pending Court Dates, Parole/Probation? No data recorded Contacted To Inform of Risk of Harm To Self or Others: No data recorded  Location of Assessment: [facility] ED   Does Patient Present under Involuntary Commitment? No  IVC Papers Initial File Date: No data recorded  [place] of Residence: [place]   Patient Currently Receiving the Following Services: Not Receiving Services   Determination of Need: No data recorded  Options For Referral: Outpatient Therapy     CCA Biopsychosocial Intake/Chief Complaint:  Per EDP: Patient is a 24 year old male who presents the emergency  department due to SI.  Patient states that he was "hanging out with his brothers" earlier tonight and they smoke marijuana.  He has smoked marijuana multiple times in the past.  No alcohol use.  He states that he went to sleep and woke up "in this state".  He states he was feeling chest pain and shortness of breath which has since alleviated.  He feels anxious as well as depressed.  Reports intermittent SI as well as HI.  He states that "sometimes he thinks about it".  No specific plan.  Patient denies any other drug use but has relatives noted to nursing staff that he "may have taken something else".  Patient tremulous on my exam and somewhat tangential.  Patient denies any visual or auditory hallucinations.  Per TTS Assessment: Patient states that he was using marijuana last night and states that his thoughts became distorted.  He states that he could not distinguish what was real and what was not real.  Patient states that he has smoked marijuana  in the past and never had any reaction like this, but states that he knows that it was the marijuana use because he states that everything is fine now.  Patient states that this event has really scared him.  Patient states that he sometimes has fleeting suicidal thoughts, but states that he does not really want to die and he has no plan or intent.  He states that he sometimes feels like he needs a break from the world.  Patient states that he has never tried to hurt himself in the past.  He denies HI.  Patient states that he has never experienced any AVH, but states that he states that he saw the world moving very fact and he felt lik he was stuck and could not move.  Patient states that last night was the sixth time in his life that he used marijuana.  He states that he has experimented with beer on 4 occasions, but states that he does not use anyhting on a regular basis.  Patient states that he sleeps 7-8 hours per night and states that his appetite has not been real good and he states that he has lost some weight, but he is unsure how much.  Patient denies any history of abuse or slf-mutilation.  Current Symptoms/Problems: Patient is currently alert, oriented and logical with no evidence of psychosis and he denies SI/HI   Patient Reported Schizophrenia/Schizoaffective Diagnosis in Past: No   Strengths: Patient states that he is caring, he has patience and he is trustworthy  Preferences: Patient has no preferences that require accommodation  Abilities: Patient states that he is a good soccer player and he is good at editing   Type of Services Patient Feels are Needed: Patient does not feel like he needs any psychiatirc services   Initial Clinical Notes/Concerns: No data recorded  Mental Health Symptoms Depression:  Increase/decrease in appetite; Weight gain/loss   Duration of Depressive symptoms: Greater than two weeks   Mania:  None   Anxiety:   None   Psychosis:  Delusions    Duration of Psychotic symptoms: Less than six months   Trauma:  None   Obsessions:  None   Compulsions:  N/A   Inattention:  None   Hyperactivity/Impulsivity:  N/A   Oppositional/Defiant Behaviors:  None   Emotional Irregularity:  None   Other Mood/Personality Symptoms:  No data recorded   Mental Status Exam Appearance and self-care  Stature:  Average  Weight:  Average weight   Clothing:  Casual   Grooming:  Well-groomed   Cosmetic use:  Age appropriate   Posture/gait:  Normal   Motor activity:  Not Remarkable   Sensorium  Attention:  Normal   Concentration:  Normal   Orientation:  Object; Person; Place; Situation; Time   Recall/memory:  Normal   Affect and Mood  Affect:  Flat; Depressed   Mood:  Depressed   Relating  Eye contact:  Normal   Facial expression:  Depressed   Attitude toward examiner:  Cooperative   Thought and Language  Speech flow: Clear and Coherent   Thought content:  Appropriate to Mood and Circumstances   Preoccupation:  None   Hallucinations:  None   Organization:  No data recorded  Affiliated Computer ServicesExecutive Functions  Fund of Knowledge:  Good   Intelligence:  Above Average   Abstraction:  Functional; Normal   Judgement:  Fair   Dance movement psychotherapisteality Testing:  Realistic   Insight:  Lacking   Decision Making:  Normal   Social Functioning  Social Maturity:  Responsible   Social Judgement:  Normal   Stress  Stressors:  Family conflict; School   Coping Ability:  Overwhelmed   Skill Deficits:  None   Supports:  Friends/Service system     Religion: Religion/Spirituality Are You A Religious Person?: Yes What is Your Religious Affiliation?: Christian How Might This Affect Treatment?: N/A  Leisure/Recreation:    Exercise/Diet: Exercise/Diet Do You Exercise?: Yes What Type of Exercise Do You Do?: Weight Training (works out several times weekly) Have You Gained or Lost A Significant Amount of Weight in the Past Six Months?:  No Do You Follow a Special Diet?: No Do You Have Any Trouble Sleeping?: No   CCA Employment/Education Employment/Work Situation: Employment / Work Psychologist, occupationalituation Employment situation: Surveyor, mineralstudent Patient's job has been impacted by current illness: No What is the longest time patient has a held a job?: N/A Where was the patient employed at that time?: N/A Has patient ever been in the [employer] Lilly and Companymilitary?: No  Education: Education Is Patient Currently Attending School?: Yes School Currently Attending: [employer] Last Grade Completed:  (patient is a Medical laboratory scientific officersophomore) Did Garment/textile technologistYou Graduate From [employer] School?: Yes Did Theme park managerYou Attend College?: Yes What Type of College Degree Do you Have?: Attending [employer] Did You Attend Graduate School?: No What Was Your Major?: Business Administration / Management Did You Have An Individualized Education Program (IIEP): No Did You Have Any Difficulty At School?: No Patient's Education Has Been Impacted by Current Illness: No   CCA Family/Childhood History Family and Relationship History: Family history Marital status: Single Are you sexually active?: Yes What is your sexual orientation?: heterosexual Has your sexual activity been affected by drugs, alcohol, medication, or emotional stress?: N/A Does patient have children?: No  Childhood History:  Childhood History By whom was/is the patient raised?: Both parents Description of patient's relationship with caregiver when they were a child: Patient states that he was close to his mother and fells like his mother cares for him Patient's description of current relationship with people who raised him/her: Patient states that he is still close to his mother, but argues with his father How were you disciplined when you got in trouble as a child/adolescent?: Patient denies any abuse and states that he was disciplined appropriately Does patient have siblings?: Yes Number of Siblings: 6 Description of patient's current relationship with  siblings: patient states that he has not been in contact with his siblings and states that he is  not close to them Did patient suffer any verbal/emotional/physical/sexual abuse as a child?: No Did patient suffer from severe childhood neglect?: No Has patient ever been sexually abused/assaulted/raped as an adolescent or adult?: No Was the patient ever a victim of a crime or a disaster?: No Witnessed domestic violence?: No Has patient been affected by domestic violence as an adult?: No  Child/Adolescent Assessment:     CCA Substance Use Alcohol/Drug Use: Alcohol / Drug Use Pain Medications: see MAR Prescriptions: see MAR Over the Counter: see MAR History of alcohol / drug use?: Yes Substance #1 Name of Substance 1: marijuana 1 - Age of First Use: 24 1 - Amount (size/oz): UTA 1 - Frequency: occasionally 1 - Duration: since onset 1 - Last Use / Amount: has used six times total with the last being last night              ASAM's:  Six Dimensions of Multidimensional Assessment  Dimension 1:  Acute Intoxication and/or Withdrawal Potential:   Dimension 1:  Description of individual's past and current experiences of substance use and withdrawal: Patient has no current withdrawal symptoms and has no need for detox  Dimension 2:  Biomedical Conditions and Complications:   Dimension 2:  Description of patient's biomedical conditions and  complications: Patient has no history of any medical issues associated with his drug use  Dimension 3:  Emotional, Behavioral, or Cognitive Conditions and Complications:  Dimension 3:  Description of emotional, behavioral, or cognitive conditions and complications: Patient has no history of mental illness, however, his use of marijuana caused him to experience some drug induced psychosis.  Dimension 4:  Readiness to Change:  Dimension 4:  Description of Readiness to Change criteria: Patient appears to understand the cause and affect of his marijuana use and  resulting psychosis and appears to be willing to consider changing his behavior  Dimension 5:  Relapse, Continued use, or Continued Problem Potential:  Dimension 5:  Relapse, continued use, or continued problem potential critiera description: Patient has not been using drugs long enough to have a dependency on marijuana  Dimension 6:  Recovery/Living Environment:  Dimension 6:  Recovery/Iiving environment criteria description: Patient lives in a safe and  stable environment  ASAM Severity Score: ASAM's Severity Rating Score: 5  ASAM Recommended Level of Treatment: ASAM Recommended Level of Treatment: Level I Outpatient Treatment   Substance use Disorder (SUD) Substance Use Disorder (SUD)  Checklist Symptoms of Substance Use: Substance(s) often taken in larger amounts or over longer times than was intended  Recommendations for Services/Supports/Treatments: Recommendations for Services/Supports/Treatments Recommendations For Services/Supports/Treatments: SAIOP (Substance Abuse Intensive Outpatient Program)  DSM5 Diagnoses: There are no problems to display for this patient.   Disposition:  Per Nanine Means, DNP, patient does not meet inpatient admission criteria and can be psychiatrically cleared for discharge.   Referrals to Alternative Service(s): Referred to Alternative Service(s):   Place:   Date:   Time:    Referred to Alternative Service(s):   Place:   Date:   Time:    Referred to Alternative Service(s):   Place:   Date:   Time:    Referred to Alternative Service(s):   Place:   Date:   Time:     Baneza Bartoszek J Vrinda Heckstall, LCAS

## 2020-12-09 NOTE — ED Provider Notes (Signed)
  Physical Exam  BP 133/85   Pulse 71   Temp (!) 97.5 F (36.4 C) (Oral)   Resp 16   Ht 5\' 4"  (1.626 m)   Wt 61.2 kg   SpO2 100%   BMI 23.17 kg/m   Physical Exam  ED Course/Procedures   Clinical Course as of 12/09/20 1420  Sat Dec 09, 2020  0153 Potassium(!!): 2.5 [LJ]  0423 Patient has been sleeping comfortably in bed.  He has been reassessed on multiple occasions.  He has now much more alert and oriented.  He is A&O x4 this time.  Patient states he is feeling much better.  Denies any SI or HI to me.  States he was just feeling unwell earlier in that "he smoked way more weed than normal".  He is eager to go home.  We will have the nursing staff ambulate the patient. [LJ]  Dec 11, 2020 Patient successfully p.o. challenge.  Nursing staff feels that patient was a little uneasy on his feet.  Will continue to monitor and will reassess. [LJ]  0557 Tetrahydrocannabinol(!): POSITIVE [LJ]    Clinical Course User Index [LJ] [id number], PA-C    Procedures  MDM  Patient care assumed from [facility] after change, please see his note for full HPI.  Briefly, patient here after THC intake smoking.  Was reporting SI during arrival.  Appeared to be sleepy.  Without any visual or auditory hallucinations.  Plan was for TTS evaluation further recommendations along with MTF.  Later by me, continues to remain sleepy, is able to answer questions.  Due to prior history of drug intake, will obtain CT head to rule out any acute pathology causing mental status change.   CT Head showed no acute pathology.  12:24 PM patient awake, alert, voicing SI.  Obtaining telepsych consultation at this time.  2:20 PM TTS consultation has been performed.  Patient is psychiatrically clear for discharge.  Family at the bedside, father is aware patient will need to continue potassium replacement while at home.  Return precautions discussed at length  Portions of this note were generated with Dragon dictation software.  Dictation errors may occur despite best attempts at proofreading.      [facility], PA-C 12/09/20 1420    Mesner, 12/11/20, MD 12/09/20 2257

## 2020-12-10 DIAGNOSIS — F39 Unspecified mood [affective] disorder: Secondary | ICD-10-CM | POA: Insufficient documentation

## 2020-12-10 DIAGNOSIS — F12988 Cannabis use, unspecified with other cannabis-induced disorder: Secondary | ICD-10-CM | POA: Insufficient documentation

## 2022-02-02 ENCOUNTER — Other Ambulatory Visit: Payer: Self-pay

## 2022-02-02 ENCOUNTER — Ambulatory Visit (HOSPITAL_COMMUNITY)
Admission: EM | Admit: 2022-02-02 | Discharge: 2022-02-02 | Disposition: A | Discharge status: Home / Self Care | Payer: Managed Care, Other (non HMO) | Attending: Urgent Care | Admitting: Urgent Care

## 2022-02-02 ENCOUNTER — Encounter (HOSPITAL_COMMUNITY): Payer: Self-pay | Admitting: Emergency Medicine

## 2022-02-02 DIAGNOSIS — H0011 Chalazion right upper eyelid: Secondary | ICD-10-CM | POA: Diagnosis not present

## 2022-02-02 MED ORDER — ERYTHROMYCIN 5 MG/GM OP OINT: TOPICAL_OINTMENT | OPHTHALMIC | 0 refills | Status: DC

## 2022-02-02 NOTE — ED Provider Notes (Signed)
[facility]    CSN: [id number] Arrival date & time: 02/02/22  1249      History   Chief Complaint Chief Complaint  Patient presents with   Eye Problem    HPI Austin Brock is a 26 y.o. male.   Pleasant 26 year old male presents today with a 2-day onset of right upper eyelid swelling.  He denies any pain.  He states something like this is happened in the past for which he assumed it was an allergic reaction to the pillow he was using to sleep.  He has not tried any treatments for his symptoms.  He denies any eye drainage.  He has no change in vision.  He denies any headache, erythema, photophobia. No fever.   Eye Problem  Past Medical History:  Diagnosis Date   Asthma     Patient Active Problem List   Diagnosis Date Noted   Cannabis-induced mood disorder (HCC)     History reviewed. No pertinent surgical history.     Home Medications    Prior to Admission medications   Medication Sig Start Date End Date Taking? Authorizing Provider  erythromycin ophthalmic ointment Place a 1/2 inch ribbon of ointment into the left lower eyelid three times daily for 5-7 days 02/02/22  Yes Dyshon Philbin L, PA    Family History Family History  Problem Relation Age of Onset   Healthy Mother    Healthy Father    Diabetes Neg Hx    Cancer Neg Hx    Heart failure Neg Hx    Hyperlipidemia Neg Hx     Social History Social History   Tobacco Use   Smoking status: Never   Smokeless tobacco: Never  Substance Use Topics   Alcohol use: No   Drug use: Yes    Types: Marijuana     Allergies   Patient has no known allergies.   Review of Systems Review of Systems  Eyes:        R upper eyelid swelling  All other systems reviewed and are negative.   Physical Exam Triage Vital Signs ED Triage Vitals [02/02/22 1455]  Enc Vitals Group     BP 133/87     Pulse Rate 66     Resp 16     Temp 98.6 F (37 C)     Temp Source Oral     SpO2 98 %     Weight       Height      Head Circumference      Peak Flow      Pain Score      Pain Loc      Pain Edu?      Excl. in GC?    No data found.  Updated Vital Signs BP 133/87 (BP Location: Left Arm)    Pulse 66    Temp 98.6 F (37 C) (Oral)    Resp 16    SpO2 98%   Visual Acuity Right Eye Distance:   Left Eye Distance:   Bilateral Distance:    Right Eye Near:   Left Eye Near:    Bilateral Near:     Physical Exam Vitals and nursing note reviewed.  Constitutional:      General: He is not in acute distress.    Appearance: Normal appearance. He is well-developed and normal weight. He is not ill-appearing, toxic-appearing or diaphoretic.  HENT:     Head: Normocephalic and atraumatic.     Right Ear: Tympanic membrane, ear  canal and external ear normal. There is no impacted cerumen.     Left Ear: Tympanic membrane, ear canal and external ear normal. There is no impacted cerumen.     Nose: Nose normal. No congestion or rhinorrhea.     Mouth/Throat:     Mouth: Mucous membranes are moist.     Pharynx: Oropharynx is clear. No oropharyngeal exudate or posterior oropharyngeal erythema.  Eyes:     General: No scleral icterus.       Right eye: No discharge.        Left eye: No discharge.     Extraocular Movements: Extraocular movements intact.     Conjunctiva/sclera: Conjunctivae normal.     Pupils: Pupils are equal, round, and reactive to light.     Comments: R upper eyelid shows a developing chalazion to the lateral portion of lid. No erythema or edema to eyelid. No discharge. No abnormalities noted to eyeball itself. EOMI.  Cardiovascular:     Rate and Rhythm: Normal rate and regular rhythm.     Heart sounds: No murmur heard. Pulmonary:     Effort: Pulmonary effort is normal. No respiratory distress.     Breath sounds: Normal breath sounds.  Musculoskeletal:     Cervical back: Normal range of motion and neck supple. No rigidity or tenderness.  Lymphadenopathy:     Cervical: No cervical  adenopathy.  Skin:    General: Skin is warm and dry.     Capillary Refill: Capillary refill takes less than 2 seconds.     Findings: No erythema or rash.  Neurological:     General: No focal deficit present.     Mental Status: He is alert.     Cranial Nerves: No cranial nerve deficit.  Psychiatric:        Mood and Affect: Mood normal.     UC Treatments / Results  Labs (all labs ordered are listed, but only abnormal results are displayed) Labs Reviewed - No data to display  EKG   Radiology No results found.  Procedures Procedures (including critical care time)  Medications Ordered in UC Medications - No data to display  Initial Impression / Assessment and Plan / UC Course  I have reviewed the triage vital signs and the nursing notes.  Pertinent labs & imaging results that were available during my care of the patient were reviewed by me and considered in my medical decision making (see chart for details).     Chalazion R upper eyelid - supportive measures primarily. Will do trial of emycin ophthalmic ointment, however pt understands this is to prevent secondary infection and that most chalazions are viral. F/U with eye specialist should sx change or worsen.  Final Clinical Impressions(s) / UC Diagnoses   Final diagnoses:  Chalazion of right upper eyelid     Discharge Instructions      Apply the eye ointment in the lower right conjunctiva 3 times daily for the next 5 to 7 days. Do not touch the tip of the applicator to the eye itself.  Holding in your hand for 5 minutes prior to administration may make it act more like a drop. Use [employer] baby shampoo on a warm washcloth to help cleanse the eye. You may also purchase cetirizine, generic Zyrtec, and take daily.     ED Prescriptions     Medication Sig Dispense Auth. Provider   erythromycin ophthalmic ointment Place a 1/2 inch ribbon of ointment into the left lower eyelid three times  daily for 5-7 days  3.5 g Harlow Basley, Ava L, PA      PDMP not reviewed this encounter.   Maretta Bees, [place] 02/03/22 [phone]

## 2022-02-02 NOTE — Discharge Instructions (Addendum)
Apply the eye ointment in the lower right conjunctiva 3 times daily for the next 5 to 7 days. Do not touch the tip of the applicator to the eye itself.  Holding in your hand for 5 minutes prior to administration may make it act more like a drop. Use [employer] baby shampoo on a warm washcloth to help cleanse the eye. You may also purchase cetirizine, generic Zyrtec, and take daily.

## 2022-02-02 NOTE — ED Triage Notes (Signed)
Patient c/o RT upper eye lid swelling x 2 days.   Patient denies trauma or changes to vision.   Patient endorses " I woke up one morning with it swollen".   Patient endorses pain.   Patient denies any eye drainage.   Patient has used ice pack and eye drops with no relief of symptoms.

## 2022-09-01 IMAGING — CT CT HEAD W/O CM
4 series · 16 of 47 positions shown, 18 images · non-contrast
Comparison: None.

CLINICAL DATA: Headache

EXAM:
CT HEAD WITHOUT CONTRAST
TECHNIQUE: Contiguous axial images were obtained from the base of the skull
through the vertex without intravenous contrast.

[Series 3: head without · axial · non-contrast · 0.46mm/px · z∈[-77,+43]mm · 7 of 34 slices shown, 9 images]
[im 5/34  brain]
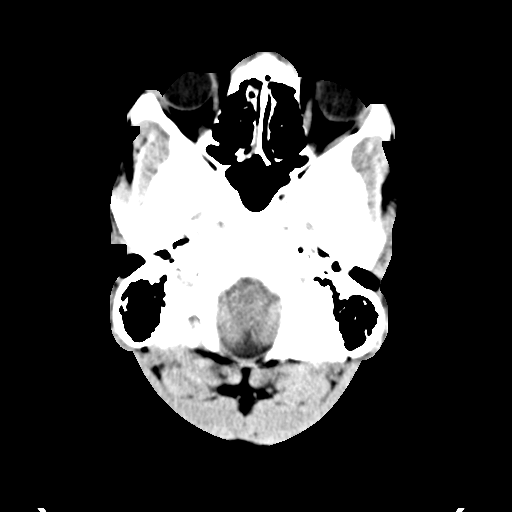
[im 5/34  bone]
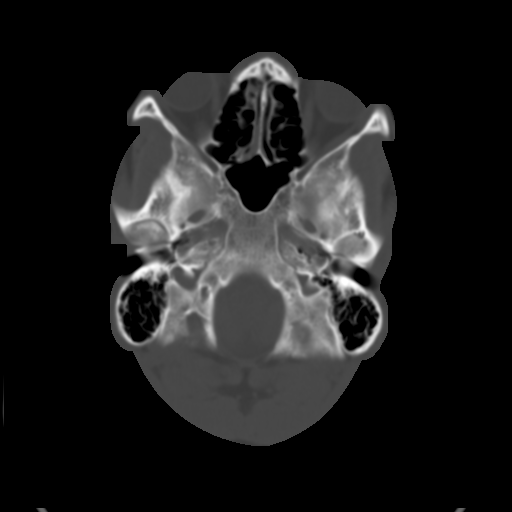
[im 9/34  brain]
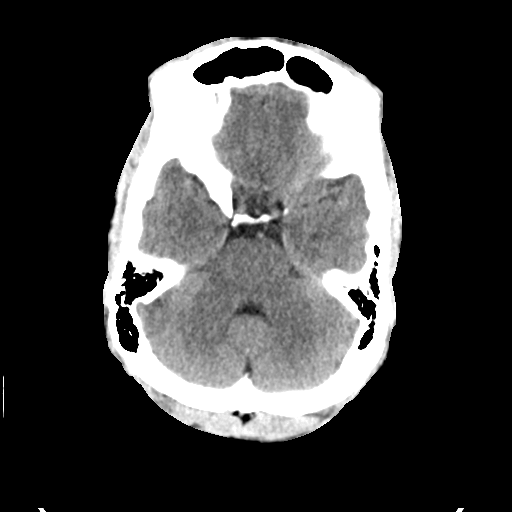
[im 13/34  brain]
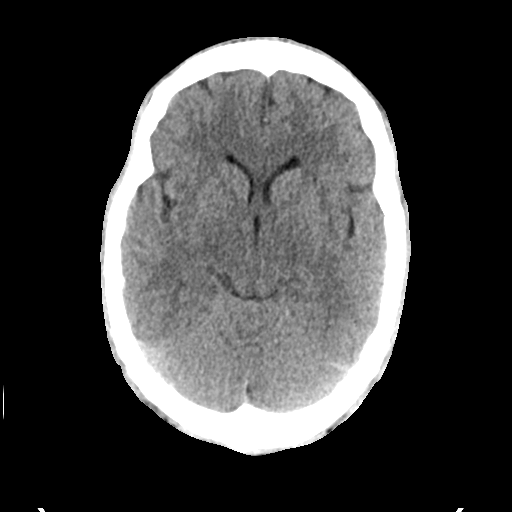
[im 17/34  brain]
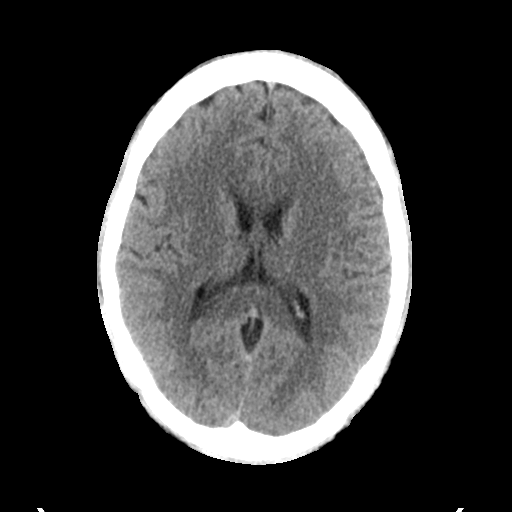
[im 21/34  brain]
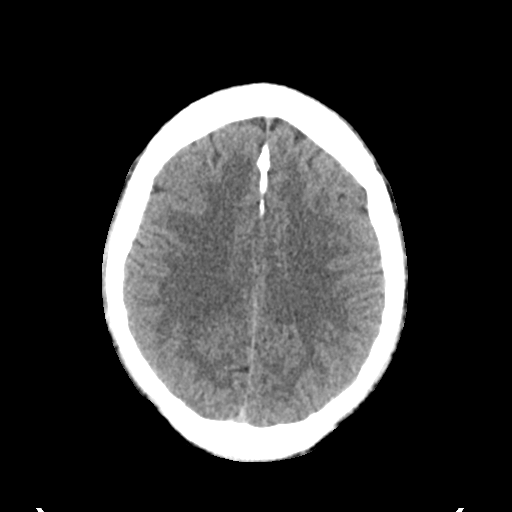
[im 21/34  bone]
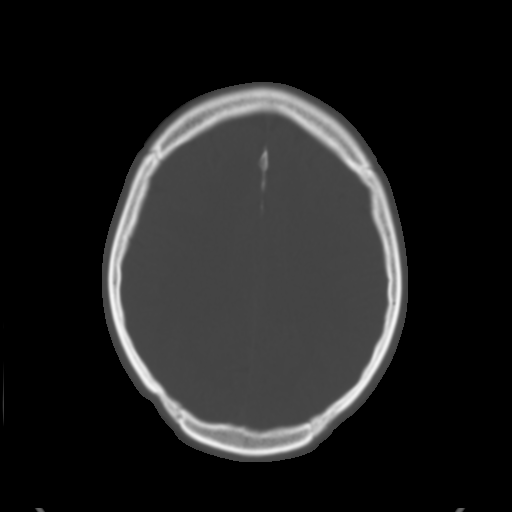
[im 25/34  brain]
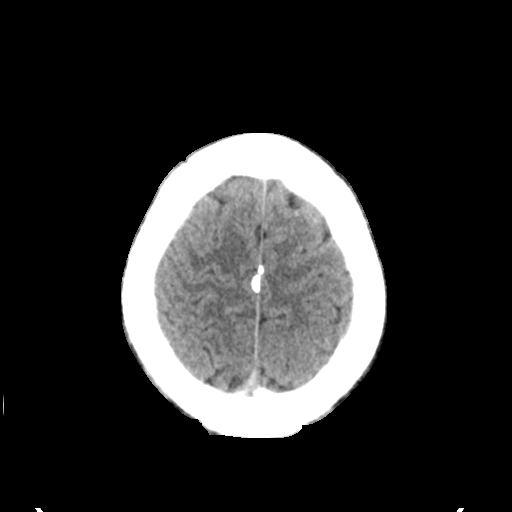
[im 29/34  brain]
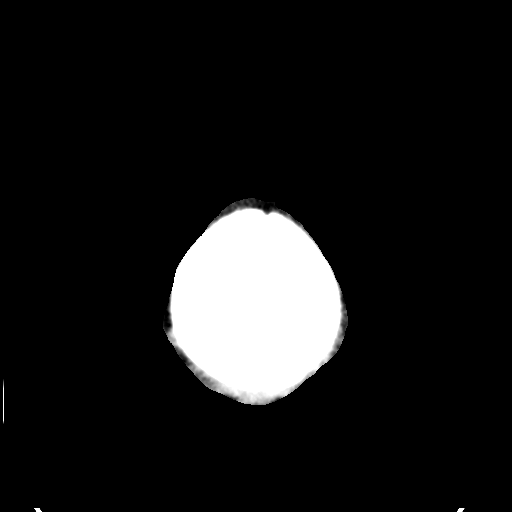

[Series 4: head bone · axial · 0.46mm/px · z∈[-81,-49]mm · 3 of 83 slices shown]
[im 9/83  bone]
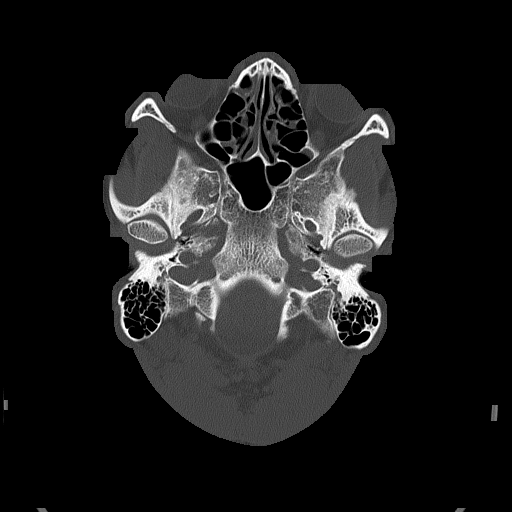
[im 17/83  bone]
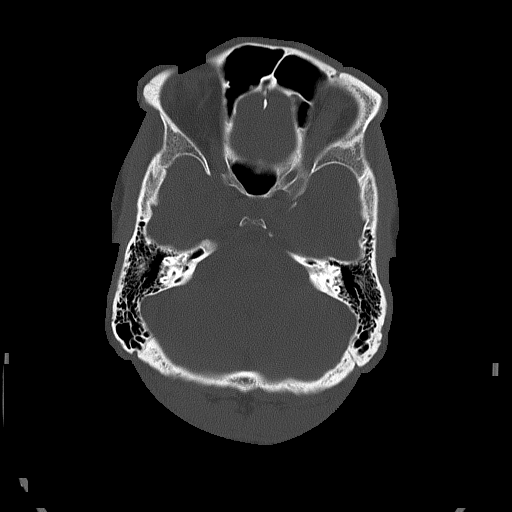
[im 25/83  bone]
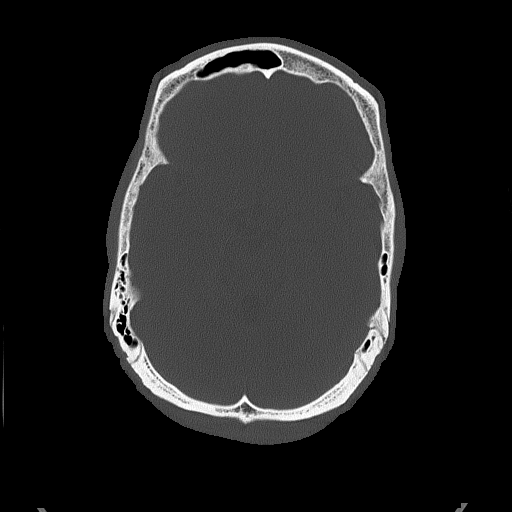

[Series 5: head without cor · coronal · non-contrast · 0.32mm/px · 3 of 77 slices shown]
[im 26/77  brain]
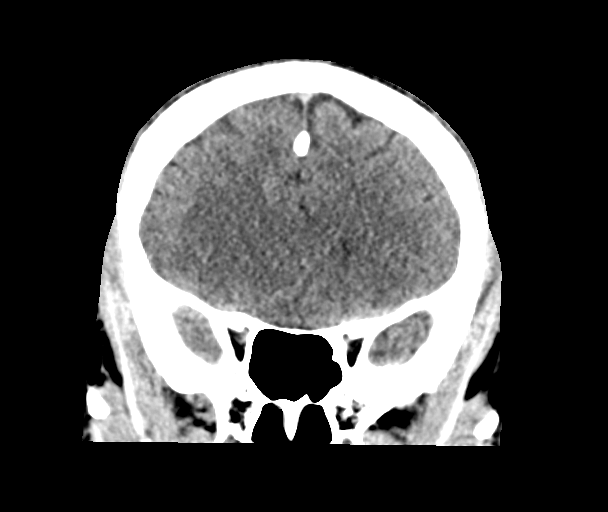
[im 34/77  brain]
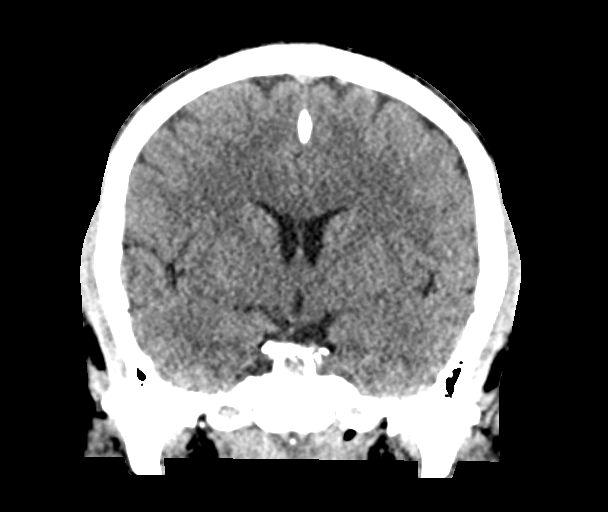
[im 43/77  brain]
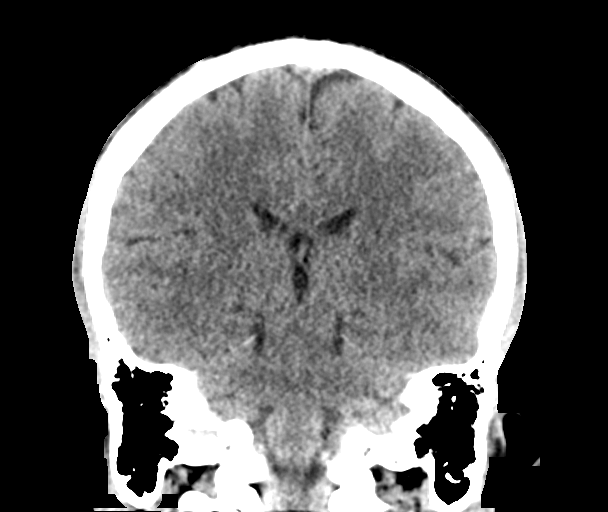

[Series 6: head without sag · sagittal · non-contrast · 0.32mm/px · 3 of 64 slices shown]
[im 22/64  brain]
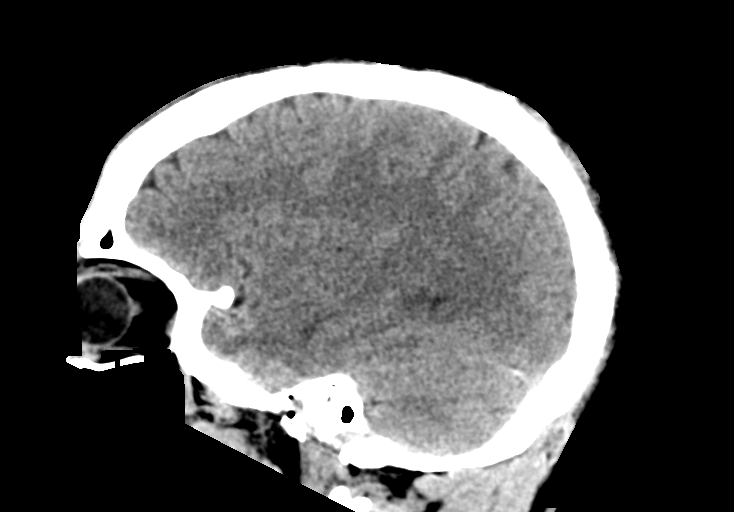
[im 32/64  brain]
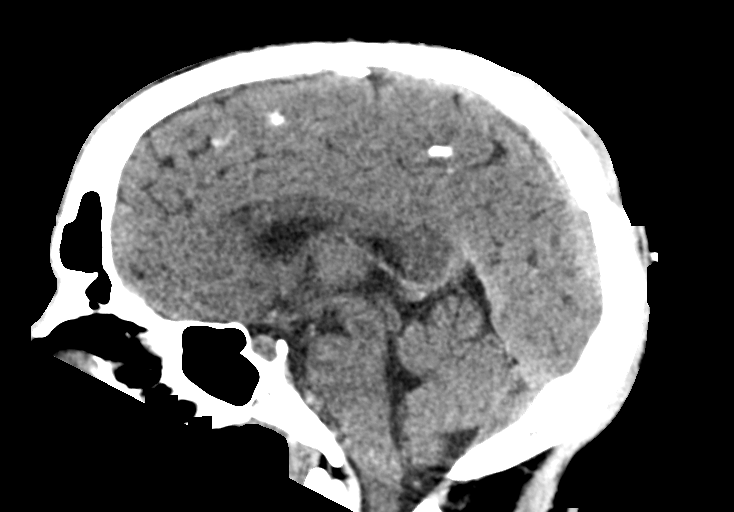
[im 43/64  brain]
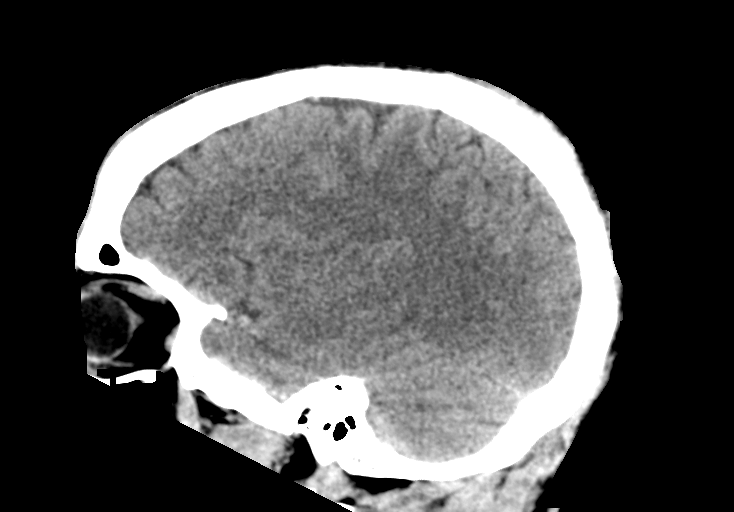

[16 of 47 positions shown; findings below may reference images not displayed]

FINDINGS: Brain: No evidence of acute infarction, hemorrhage, hydrocephalus,
extra-axial collection or mass lesion/mass effect.

Vascular: No hyperdense vessel or unexpected calcification.

Skull: Normal. Negative for fracture or focal lesion.

Sinuses/Orbits: No acute finding.

Other: None.
IMPRESSION: No acute intracranial findings.

## 2023-08-08 ENCOUNTER — Other Ambulatory Visit: Payer: Self-pay | Admitting: Nurse Practitioner

## 2023-08-08 ENCOUNTER — Ambulatory Visit: Payer: Self-pay

## 2023-08-08 DIAGNOSIS — S9781XA Crushing injury of right foot, initial encounter: Secondary | ICD-10-CM

## 2024-01-30 ENCOUNTER — Telehealth: Payer: Self-pay | Admitting: Physician Assistant

## 2024-01-30 DIAGNOSIS — B9689 Other specified bacterial agents as the cause of diseases classified elsewhere: Secondary | ICD-10-CM

## 2024-01-30 MED ORDER — PREDNISONE 10 MG (21) PO TBPK
ORAL_TABLET | ORAL | 0 refills | Status: DC
Start: 2024-01-30 — End: 2024-02-01

## 2024-01-30 MED ORDER — BENZONATATE 100 MG PO CAPS: 100.0000 mg | ORAL_CAPSULE | Freq: Three times a day (TID) | ORAL | 0 refills | Status: DC | PRN

## 2024-01-30 MED ORDER — DOXYCYCLINE HYCLATE 100 MG PO TABS: 100.0000 mg | ORAL_TABLET | Freq: Two times a day (BID) | ORAL | 0 refills | Status: DC

## 2024-01-30 NOTE — Progress Notes (Signed)
E-Visit for Cough  We are sorry that you are not feeling well.  Here is how we plan to help!  Based on your presentation I believe you most likely have A cough due to bacteria.  When patients have a fever and a productive cough with a change in color or increased sputum production, we are concerned about bacterial bronchitis.  If left untreated it can progress to pneumonia.  If your symptoms do not improve with your treatment plan it is important that you contact your provider.   I have prescribed Doxycycline 100 mg twice a day for 7 days     In addition you may use A non-prescription cough medication called Mucinex DM: take 2 tablets every 12 hours. and A prescription cough medication called Tessalon Perles 100mg . You may take 1-2 capsules every 8 hours as needed for your cough.  Prednisone 10 mg daily for 6 days (see taper instructions below)  Directions for 6 day taper: Day 1: 2 tablets before breakfast, 1 after both lunch & dinner and 2 at bedtime Day 2: 1 tab before breakfast, 1 after both lunch & dinner and 2 at bedtime Day 3: 1 tab at each meal & 1 at bedtime Day 4: 1 tab at breakfast, 1 at lunch, 1 at bedtime Day 5: 1 tab at breakfast & 1 tab at bedtime Day 6: 1 tab at breakfast  From your responses in the eVisit questionnaire you describe inflammation in the upper respiratory tract which is causing a significant cough.  This is commonly called Bronchitis and has four common causes:   Allergies Viral Infections Acid Reflux Bacterial Infection Allergies, viruses and acid reflux are treated by controlling symptoms or eliminating the cause. An example might be a cough caused by taking certain blood pressure medications. You stop the cough by changing the medication. Another example might be a cough caused by acid reflux. Controlling the reflux helps control the cough.  USE OF BRONCHODILATOR ("RESCUE") INHALERS: There is a risk from using your bronchodilator too frequently.  The risk is  that over-reliance on a medication which only relaxes the muscles surrounding the breathing tubes can reduce the effectiveness of medications prescribed to reduce swelling and congestion of the tubes themselves.  Although you feel brief relief from the bronchodilator inhaler, your asthma may actually be worsening with the tubes becoming more swollen and filled with mucus.  This can delay other crucial treatments, such as oral steroid medications. If you need to use a bronchodilator inhaler daily, several times per day, you should discuss this with your provider.  There are probably better treatments that could be used to keep your asthma under control.     HOME CARE Only take medications as instructed by your medical team. Complete the entire course of an antibiotic. Drink plenty of fluids and get plenty of rest. Avoid close contacts especially the very young and the elderly Cover your mouth if you cough or cough into your sleeve. Always remember to wash your hands A steam or ultrasonic humidifier can help congestion.   GET HELP RIGHT AWAY IF: You develop worsening fever. You become short of breath You cough up blood. Your symptoms persist after you have completed your treatment plan MAKE SURE YOU  Understand these instructions. Will watch your condition. Will get help right away if you are not doing well or get worse.    Thank you for choosing an e-visit.  Your e-visit answers were reviewed by a board certified advanced clinical practitioner  to complete your personal care plan. Depending upon the condition, your plan could have included both over the counter or prescription medications.  Please review your pharmacy choice. Make sure the pharmacy is open so you can pick up prescription now. If there is a problem, you may contact your provider through [employer] and have the prescription routed to another pharmacy.  Your safety is important to [place]. If you have drug allergies check your  prescription carefully.   For the next 24 hours you can use MyChart to ask questions about today's visit, request a non-urgent call back, or ask for a work or school excuse. You will get an email in the next two days asking about your experience. I hope that your e-visit has been valuable and will speed your recovery.  I have spent 5 minutes in review of e-visit questionnaire, review and updating patient chart, medical decision making and response to patient.   Margaretann Loveless, PA-C

## 2024-02-01 ENCOUNTER — Encounter (HOSPITAL_COMMUNITY): Payer: Self-pay

## 2024-02-01 ENCOUNTER — Other Ambulatory Visit: Payer: Self-pay

## 2024-02-01 ENCOUNTER — Ambulatory Visit (HOSPITAL_COMMUNITY)
Admission: RE | Admit: 2024-02-01 | Discharge: 2024-02-01 | Disposition: A | Discharge status: Home / Self Care | Payer: Self-pay | Source: Ambulatory Visit | Attending: Emergency Medicine | Admitting: Emergency Medicine

## 2024-02-01 VITALS — BP 128/75 | HR 62 | Temp 98.0°F | Resp 18

## 2024-02-01 DIAGNOSIS — B9689 Other specified bacterial agents as the cause of diseases classified elsewhere: Secondary | ICD-10-CM

## 2024-02-01 DIAGNOSIS — Z0289 Encounter for other administrative examinations: Secondary | ICD-10-CM

## 2024-02-01 DIAGNOSIS — Z7689 Persons encountering health services in other specified circumstances: Secondary | ICD-10-CM

## 2024-02-01 DIAGNOSIS — R051 Acute cough: Secondary | ICD-10-CM

## 2024-02-01 MED ORDER — DOXYCYCLINE HYCLATE 100 MG PO TABS: 100.0000 mg | ORAL_TABLET | Freq: Two times a day (BID) | ORAL | 0 refills | Status: DC

## 2024-02-01 MED ORDER — BENZONATATE 100 MG PO CAPS: 100.0000 mg | ORAL_CAPSULE | Freq: Three times a day (TID) | ORAL | 0 refills | Status: DC | PRN

## 2024-02-01 MED ORDER — PREDNISONE 10 MG (21) PO TBPK: ORAL_TABLET | ORAL | 0 refills | Status: DC

## 2024-02-01 NOTE — ED Triage Notes (Signed)
Pt had a E visit on 2-14 . Pt could not find his RX's that were prescribed . THE 3 Rx's are listed at [facility].  Pt has questions about returning to work.

## 2024-02-01 NOTE — Discharge Instructions (Signed)
I have resent your prescriptions to the [facility] on [address].  I have also provided you with a work note which is attached to the back of your paperwork.  Return here as needed.

## 2024-02-01 NOTE — ED Provider Notes (Signed)
[facility]    CSN: [id number] Arrival date & time: 02/01/24  1118      History   Chief Complaint Chief Complaint  Patient presents with   Cough    I had a e visit and my doctor recommended me to come in for a in person visit due to my concerns. - Entered by patient   Shortness of Breath    HPI Austin Brock is a 28 y.o. male.   Patient presents with persistent cough and mild shortness of breath after E-visit on 2/14.  Patient states that he went to the pharmacy and prescriptions were not available.  Patient states that he asked for prescriptions to be sent to the [facility] on [address] and they were sent to the one on [address].  Patient is also requesting a return to work note.  Denies any new or worsening symptoms   Cough Associated symptoms: shortness of breath   Shortness of Breath Associated symptoms: cough     Past Medical History:  Diagnosis Date   Asthma     Patient Active Problem List   Diagnosis Date Noted   Cannabis-induced mood disorder (HCC)     History reviewed. No pertinent surgical history.     Home Medications    Prior to Admission medications   Medication Sig Start Date End Date Taking? Authorizing Provider  benzonatate (TESSALON) 100 MG capsule Take 1-2 capsules (100-200 mg total) by mouth 3 (three) times daily as needed. 02/01/24   Wynonia Lawman A, NP  doxycycline (VIBRA-TABS) 100 MG tablet Take 1 tablet (100 mg total) by mouth 2 (two) times daily. 02/01/24   Wynonia Lawman A, NP  erythromycin ophthalmic ointment Place a 1/2 inch ribbon of ointment into the left lower eyelid three times daily for 5-7 days 02/02/22   Guy Sandifer L, PA  predniSONE (STERAPRED UNI-PAK 21 TAB) 10 MG (21) TBPK tablet 6 day taper; take as directed on package instructions 02/01/24   Letta Kocher, NP    Family History Family History  Problem Relation Age of Onset   Healthy Mother    Healthy Father    Diabetes Neg Hx    Cancer Neg Hx     Heart failure Neg Hx    Hyperlipidemia Neg Hx     Social History Social History   Tobacco Use   Smoking status: Never   Smokeless tobacco: Never  Substance Use Topics   Alcohol use: No   Drug use: Yes    Types: Marijuana     Allergies   Patient has no known allergies.   Review of Systems Review of Systems  Respiratory:  Positive for cough and shortness of breath.    Per HPI  Physical Exam Triage Vital Signs ED Triage Vitals  Encounter Vitals Group     BP 02/01/24 1141 128/75     Systolic BP Percentile --      Diastolic BP Percentile --      Pulse Rate 02/01/24 1141 62     Resp 02/01/24 1141 18     Temp 02/01/24 1141 98 F (36.7 C)     Temp src --      SpO2 02/01/24 1141 97 %     Weight --      Height --      Head Circumference --      Peak Flow --      Pain Score 02/01/24 1140 0     Pain Loc --  Pain Education --      Exclude from Growth Chart --    No data found.  Updated Vital Signs BP 128/75   Pulse 62   Temp 98 F (36.7 C)   Resp 18   SpO2 97%   Visual Acuity Right Eye Distance:   Left Eye Distance:   Bilateral Distance:    Right Eye Near:   Left Eye Near:    Bilateral Near:     Physical Exam Vitals and nursing note reviewed.  Constitutional:      General: He is awake. He is not in acute distress.    Appearance: Normal appearance. He is well-developed and well-groomed. He is not ill-appearing.  HENT:     Right Ear: Tympanic membrane, ear canal and external ear normal.     Left Ear: Tympanic membrane, ear canal and external ear normal.     Nose: Congestion and rhinorrhea present.     Mouth/Throat:     Mouth: Mucous membranes are moist.     Pharynx: Posterior oropharyngeal erythema present. No oropharyngeal exudate.  Cardiovascular:     Rate and Rhythm: Normal rate and regular rhythm.  Pulmonary:     Effort: Pulmonary effort is normal.     Breath sounds: Normal breath sounds.  Musculoskeletal:        General: Normal range  of motion.  Skin:    General: Skin is warm and dry.  Neurological:     Mental Status: He is alert.  Psychiatric:        Behavior: Behavior is cooperative.      UC Treatments / Results  Labs (all labs ordered are listed, but only abnormal results are displayed) Labs Reviewed - No data to display  EKG   Radiology No results found.  Procedures Procedures (including critical care time)  Medications Ordered in UC Medications - No data to display  Initial Impression / Assessment and Plan / UC Course  I have reviewed the triage vital signs and the nursing notes.  Pertinent labs & imaging results that were available during my care of the patient were reviewed by me and considered in my medical decision making (see chart for details).     Patient presented with persistent cough and mild shortness of breath after E-visit on 2/14.  Patient requesting prescriptions be sent to a different pharmacy.  Patient also requesting return to work note.  Upon assessment congestion and rhinorrhea present, mild erythema noted to pharynx.  Lungs clear bilaterally on auscultation.  No other significant findings upon exam.  Re-prescribed Tessalon, doxycycline, and prednisone and sent them to pharmacy requested by patient.  Given return to work note.  Discussed return precautions. Final Clinical Impressions(s) / UC Diagnoses   Final diagnoses:  Acute cough  Encounter for new medication prescription  Encounter to obtain excuse from work     Discharge Instructions      I have resent your prescriptions to the [facility] on [address].  I have also provided you with a work note which is attached to the back of your paperwork.  Return here as needed.    ED Prescriptions     Medication Sig Dispense Auth. Provider   benzonatate (TESSALON) 100 MG capsule Take 1-2 capsules (100-200 mg total) by mouth 3 (three) times daily as needed. 30 capsule Wynonia Lawman A, NP   doxycycline  (VIBRA-TABS) 100 MG tablet Take 1 tablet (100 mg total) by mouth 2 (two) times daily. 20 tablet Letta Kocher, NP  predniSONE (STERAPRED UNI-PAK 21 TAB) 10 MG (21) TBPK tablet 6 day taper; take as directed on package instructions 21 tablet Wynonia Lawman A, NP      PDMP not reviewed this encounter.   Wynonia Lawman A, NP 02/01/24 1242

## 2024-08-10 ENCOUNTER — Telehealth: Payer: Self-pay | Admitting: Nurse Practitioner

## 2024-08-10 DIAGNOSIS — L739 Follicular disorder, unspecified: Secondary | ICD-10-CM

## 2024-08-10 MED ORDER — FLUCONAZOLE 150 MG PO TABS: 150.0000 mg | ORAL_TABLET | Freq: Every day | ORAL | 0 refills | Status: AC

## 2024-08-10 NOTE — Progress Notes (Signed)
 Message sent to patient requesting further input regarding current symptoms. Awaiting patient response.

## 2024-08-10 NOTE — Progress Notes (Signed)
 E Visit for Rash  We are sorry that you are not feeling well. Here is how we plan to help!  Based on what you have shared with me you have a folliculitis, likely fungal in nature. Keep the skin clean and dry. Drink plenty of water. I have sent in an oral Fluconazole  150 mg once daily for 7 days.  Try to only use unscented and non-dyed soaps, lotions, etc.  If you note any non-resolving, new, or worsening symptoms despite treatment, please seek an in-person evaluation ASAP.    HOME CARE:  Take cool showers and avoid direct sunlight. Apply cool compress or wet dressings. Take a bath in an oatmeal bath.  Sprinkle content of one Aveeno packet under running faucet with comfortably warm water.  Bathe for 15-20 minutes, 1-2 times daily.  Pat dry with a towel. Do not rub the rash. Use hydrocortisone cream. Take an antihistamine like Benadryl for widespread rashes that itch.  The adult dose of Benadryl is 25-50 mg by mouth 4 times daily. Caution:  This type of medication may cause sleepiness.  Do not drink alcohol, drive, or operate dangerous machinery while taking antihistamines.  Do not take these medications if you have prostate enlargement.  Read package instructions thoroughly on all medications that you take.  GET HELP RIGHT AWAY IF:  Symptoms don't go away after treatment. Severe itching that persists. If you rash spreads or swells. If you rash begins to smell. If it blisters and opens or develops a yellow-brown crust. You develop a fever. You have a sore throat. You become short of breath.  MAKE SURE YOU:  Understand these instructions. Will watch your condition. Will get help right away if you are not doing well or get worse.  Thank you for choosing an e-visit.  Your e-visit answers were reviewed by a board certified advanced clinical practitioner to complete your personal care plan. Depending upon the condition, your plan could have included both over the counter or  prescription medications.  Please review your pharmacy choice. Make sure the pharmacy is open so you can pick up prescription now. If there is a problem, you may contact your provider through [employer] and have the prescription routed to another pharmacy.  Your safety is important to us . If you have drug allergies check your prescription carefully.   For the next 24 hours you can use MyChart to ask questions about today's visit, request a non-urgent call back, or ask for a work or school excuse. You will get an email in the next two days asking about your experience. I hope that your e-visit has been valuable and will speed your recovery.

## 2024-08-10 NOTE — Progress Notes (Signed)
 I have spent 5 minutes in review of e-visit questionnaire, review and updating patient chart, medical decision making and response to patient.   Elsie Velma Lunger, PA-C

## 2024-09-04 ENCOUNTER — Ambulatory Visit (HOSPITAL_COMMUNITY)
Admission: EM | Admit: 2024-09-04 | Discharge: 2024-09-04 | Disposition: A | Discharge status: Home / Self Care | Payer: Self-pay | Attending: Family Medicine | Admitting: Family Medicine

## 2024-09-04 ENCOUNTER — Encounter (HOSPITAL_COMMUNITY): Payer: Self-pay

## 2024-09-04 DIAGNOSIS — R21 Rash and other nonspecific skin eruption: Secondary | ICD-10-CM

## 2024-09-04 DIAGNOSIS — L299 Pruritus, unspecified: Secondary | ICD-10-CM

## 2024-09-04 MED ORDER — PERMETHRIN 5 % EX CREA: TOPICAL_CREAM | CUTANEOUS | 1 refills | Status: AC

## 2024-09-04 MED ORDER — PREDNISONE 10 MG (48) PO TBPK: ORAL_TABLET | ORAL | 0 refills | Status: AC

## 2024-09-04 NOTE — ED Triage Notes (Signed)
 Patient reports that he has had a rash x 3 weeks. Patient did an E-visit on 08/10/24 and was prescribed a pill x 7 days and Cortisone cream, but states the rash is now all over and having increased itching.

## 2024-09-08 NOTE — ED Provider Notes (Signed)
 [facility]   [id number] 09/04/24 Arrival Time: 1635  ASSESSMENT & PLAN:  1. Rash and nonspecific skin eruption   2. Itching    Unclear etiology.  Begin: Meds ordered this encounter  Medications   permethrin  (ELIMITE ) 5 % cream    Sig: Apply from neck down before bed then wash off in the morning. May repeat in one week.    Dispense:  60 g    Refill:  1   predniSONE  (STERAPRED UNI-PAK 48 TAB) 10 MG (48) TBPK tablet    Sig: Take as directed.    Dispense:  48 tablet    Refill:  0    Follow-up Information     [facility].   Specialty: Urgent Care Why: If worsening or failing to improve as anticipated. Contact information: [address] [place]  [address] [phone]                 Will follow up with PCP or here if worsening or failing to improve as anticipated. Reviewed expectations re: course of current medical issues. Questions answered. Outlined signs and symptoms indicating need for more acute intervention. Patient verbalized understanding. After Visit Summary given.   SUBJECTIVE:  Austin  Brock is a 28 y.o. male who presents with a skin complaint. Patient reports that he has had a rash x 3 weeks. Patient did an E-visit on 08/10/24 and was prescribed a pill x 7 days and Cortisone cream, but states the rash is now all over and having increased itching. Did travel and stay at local motel a month or so ago. Denies fever.     OBJECTIVE: Vitals:   09/04/24 1707  BP: 112/60  Pulse: 62  Resp: 14  Temp: 98.1 F (36.7 C)  TempSrc: Oral  SpO2: 98%    General appearance: alert; no distress HEENT: ; AT Neck: supple with FROM Extremities: no edema; moves all extremities normally Skin: warm and dry; a few solitary few mm red indurations over upper arms and around waist; otherwise unremarkable skin Psychological: alert and cooperative; normal mood and affect  No Known Allergies  Past  Medical History:  Diagnosis Date   Asthma    Social History   Socioeconomic History   Marital status: Single    Spouse name: Not on file   Number of children: Not on file   Years of education: Not on file   Highest education level: Not on file  Occupational History   Not on file  Tobacco Use   Smoking status: Never   Smokeless tobacco: Never  Vaping Use   Vaping status: Never Used  Substance and Sexual Activity   Alcohol use: No   Drug use: Yes    Types: Marijuana   Sexual activity: Not on file  Other Topics Concern   Not on file  Social History Narrative   Not on file   Social Drivers of Health   Financial Resource Strain: Not on file  Food Insecurity: Not on file  Transportation Needs: Not on file  Physical Activity: Not on file  Stress: Not on file  Social Connections: Not on file  Intimate Partner Violence: Not on file   Family History  Problem Relation Age of Onset   Healthy Mother    Healthy Father    Diabetes Neg Hx    Cancer Neg Hx    Heart failure Neg Hx    Hyperlipidemia Neg Hx    History reviewed. No pertinent surgical history.  Rolinda Rogue, MD 09/08/24 1021
